# Patient Record
Sex: Female | Born: 1965
Health system: Southern US, Community
[De-identification: ages and names within clinical notes are randomized; demographics above are authoritative.]

## PROBLEM LIST (undated history)

## (undated) DIAGNOSIS — M1990 Unspecified osteoarthritis, unspecified site: Secondary | ICD-10-CM

## (undated) DIAGNOSIS — E669 Obesity, unspecified: Secondary | ICD-10-CM

## (undated) DIAGNOSIS — K219 Gastro-esophageal reflux disease without esophagitis: Secondary | ICD-10-CM

## (undated) DIAGNOSIS — M722 Plantar fascial fibromatosis: Secondary | ICD-10-CM

## (undated) HISTORY — DX: Unspecified osteoarthritis, unspecified site: M19.90

## (undated) HISTORY — DX: Obesity, unspecified: E66.9

## (undated) HISTORY — DX: Gastro-esophageal reflux disease without esophagitis: K21.9

## (undated) HISTORY — DX: Plantar fascial fibromatosis: M72.2

---

## 1998-07-06 ENCOUNTER — Emergency Department (HOSPITAL_COMMUNITY): Admission: EM | Admit: 1998-07-06 | Discharge: 1998-07-06 | Payer: Self-pay | Admitting: Emergency Medicine

## 1998-07-06 ENCOUNTER — Encounter: Payer: Self-pay | Admitting: Emergency Medicine

## 1999-09-29 ENCOUNTER — Emergency Department (HOSPITAL_COMMUNITY): Admission: EM | Admit: 1999-09-29 | Discharge: 1999-09-29 | Payer: Self-pay | Admitting: *Deleted

## 2000-04-04 ENCOUNTER — Encounter: Admission: RE | Admit: 2000-04-04 | Discharge: 2000-04-04 | Payer: Self-pay | Admitting: Family Medicine

## 2000-04-07 ENCOUNTER — Encounter: Admission: RE | Admit: 2000-04-07 | Discharge: 2000-04-07 | Payer: Self-pay | Admitting: Family Medicine

## 2000-05-06 ENCOUNTER — Encounter (INDEPENDENT_AMBULATORY_CARE_PROVIDER_SITE_OTHER): Payer: Self-pay | Admitting: *Deleted

## 2000-05-07 ENCOUNTER — Encounter: Admission: RE | Admit: 2000-05-07 | Discharge: 2000-05-07 | Payer: Self-pay | Admitting: Family Medicine

## 2000-05-18 ENCOUNTER — Inpatient Hospital Stay (HOSPITAL_COMMUNITY): Admission: AD | Admit: 2000-05-18 | Discharge: 2000-05-18 | Payer: Self-pay | Admitting: Obstetrics & Gynecology

## 2000-05-26 ENCOUNTER — Inpatient Hospital Stay (HOSPITAL_COMMUNITY): Admission: AD | Admit: 2000-05-26 | Discharge: 2000-05-26 | Payer: Self-pay | Admitting: Family Medicine

## 2000-05-26 ENCOUNTER — Encounter: Admission: RE | Admit: 2000-05-26 | Discharge: 2000-05-26 | Payer: Self-pay | Admitting: Family Medicine

## 2000-05-26 ENCOUNTER — Encounter: Payer: Self-pay | Admitting: *Deleted

## 2000-06-03 ENCOUNTER — Encounter: Admission: RE | Admit: 2000-06-03 | Discharge: 2000-06-03 | Payer: Self-pay | Admitting: Family Medicine

## 2000-06-12 ENCOUNTER — Inpatient Hospital Stay (HOSPITAL_COMMUNITY): Admission: AD | Admit: 2000-06-12 | Discharge: 2000-06-12 | Payer: Self-pay | Admitting: Obstetrics

## 2000-06-30 ENCOUNTER — Ambulatory Visit (HOSPITAL_COMMUNITY): Admission: RE | Admit: 2000-06-30 | Discharge: 2000-06-30 | Payer: Self-pay | Admitting: Family Medicine

## 2000-07-02 ENCOUNTER — Encounter: Admission: RE | Admit: 2000-07-02 | Discharge: 2000-07-02 | Payer: Self-pay | Admitting: Family Medicine

## 2000-07-16 ENCOUNTER — Encounter: Admission: RE | Admit: 2000-07-16 | Discharge: 2000-07-16 | Payer: Self-pay | Admitting: Family Medicine

## 2000-07-31 ENCOUNTER — Encounter: Admission: RE | Admit: 2000-07-31 | Discharge: 2000-07-31 | Payer: Self-pay | Admitting: Family Medicine

## 2000-08-13 ENCOUNTER — Encounter: Payer: Self-pay | Admitting: Obstetrics

## 2000-08-13 ENCOUNTER — Encounter (INDEPENDENT_AMBULATORY_CARE_PROVIDER_SITE_OTHER): Payer: Self-pay

## 2000-08-13 ENCOUNTER — Encounter: Admission: RE | Admit: 2000-08-13 | Discharge: 2000-08-13 | Payer: Self-pay | Admitting: Family Medicine

## 2000-08-13 ENCOUNTER — Inpatient Hospital Stay (HOSPITAL_COMMUNITY): Admission: AD | Admit: 2000-08-13 | Discharge: 2000-09-26 | Payer: Self-pay | Admitting: Obstetrics

## 2000-08-15 ENCOUNTER — Encounter: Payer: Self-pay | Admitting: Family Medicine

## 2000-08-16 ENCOUNTER — Encounter: Payer: Self-pay | Admitting: Obstetrics

## 2000-08-16 ENCOUNTER — Encounter: Payer: Self-pay | Admitting: *Deleted

## 2000-08-27 ENCOUNTER — Encounter: Payer: Self-pay | Admitting: Obstetrics

## 2000-09-08 ENCOUNTER — Encounter: Payer: Self-pay | Admitting: *Deleted

## 2000-09-11 ENCOUNTER — Encounter: Payer: Self-pay | Admitting: Obstetrics

## 2000-09-15 ENCOUNTER — Encounter: Payer: Self-pay | Admitting: Obstetrics

## 2000-09-18 ENCOUNTER — Encounter: Payer: Self-pay | Admitting: Obstetrics

## 2000-09-19 ENCOUNTER — Encounter: Payer: Self-pay | Admitting: Obstetrics & Gynecology

## 2000-09-20 ENCOUNTER — Encounter: Payer: Self-pay | Admitting: *Deleted

## 2000-09-23 ENCOUNTER — Encounter: Payer: Self-pay | Admitting: Obstetrics

## 2000-09-27 ENCOUNTER — Encounter: Admission: RE | Admit: 2000-09-27 | Discharge: 2000-10-27 | Payer: Self-pay | Admitting: *Deleted

## 2000-09-28 ENCOUNTER — Inpatient Hospital Stay (HOSPITAL_COMMUNITY): Admission: AD | Admit: 2000-09-28 | Discharge: 2000-09-28 | Payer: Self-pay | Admitting: Obstetrics

## 2000-09-30 ENCOUNTER — Inpatient Hospital Stay (HOSPITAL_COMMUNITY): Admission: AD | Admit: 2000-09-30 | Discharge: 2000-09-30 | Payer: Self-pay | Admitting: Obstetrics

## 2000-10-03 ENCOUNTER — Encounter: Admission: RE | Admit: 2000-10-03 | Discharge: 2000-10-03 | Payer: Self-pay | Admitting: Family Medicine

## 2000-10-21 ENCOUNTER — Encounter: Admission: RE | Admit: 2000-10-21 | Discharge: 2000-10-21 | Payer: Self-pay | Admitting: Sports Medicine

## 2000-10-28 ENCOUNTER — Encounter: Admission: RE | Admit: 2000-10-28 | Discharge: 2000-11-27 | Payer: Self-pay | Admitting: *Deleted

## 2000-11-20 ENCOUNTER — Encounter: Payer: Self-pay | Admitting: Emergency Medicine

## 2000-11-20 ENCOUNTER — Emergency Department (HOSPITAL_COMMUNITY): Admission: EM | Admit: 2000-11-20 | Discharge: 2000-11-20 | Payer: Self-pay | Admitting: Emergency Medicine

## 2000-11-21 ENCOUNTER — Encounter: Admission: RE | Admit: 2000-11-21 | Discharge: 2000-11-21 | Payer: Self-pay | Admitting: Family Medicine

## 2003-03-04 ENCOUNTER — Other Ambulatory Visit: Admission: RE | Admit: 2003-03-04 | Discharge: 2003-03-04 | Payer: Self-pay | Admitting: Family Medicine

## 2005-05-23 ENCOUNTER — Encounter: Admission: RE | Admit: 2005-05-23 | Discharge: 2005-05-23 | Payer: Self-pay | Admitting: Family Medicine

## 2005-07-21 ENCOUNTER — Emergency Department (HOSPITAL_COMMUNITY): Admission: EM | Admit: 2005-07-21 | Discharge: 2005-07-21 | Payer: Self-pay | Admitting: Emergency Medicine

## 2005-07-22 ENCOUNTER — Other Ambulatory Visit: Admission: RE | Admit: 2005-07-22 | Discharge: 2005-07-22 | Payer: Self-pay | Admitting: Family Medicine

## 2006-06-25 ENCOUNTER — Encounter: Admission: RE | Admit: 2006-06-25 | Discharge: 2006-06-25 | Payer: Self-pay | Admitting: Family Medicine

## 2006-07-04 ENCOUNTER — Encounter (INDEPENDENT_AMBULATORY_CARE_PROVIDER_SITE_OTHER): Payer: Self-pay | Admitting: *Deleted

## 2007-01-27 ENCOUNTER — Other Ambulatory Visit: Admission: RE | Admit: 2007-01-27 | Discharge: 2007-01-27 | Payer: Self-pay | Admitting: Family Medicine

## 2007-05-18 ENCOUNTER — Ambulatory Visit (HOSPITAL_COMMUNITY): Admission: RE | Admit: 2007-05-18 | Discharge: 2007-05-18 | Payer: Self-pay | Admitting: Family Medicine

## 2007-08-24 ENCOUNTER — Encounter: Admission: RE | Admit: 2007-08-24 | Discharge: 2007-08-24 | Payer: Self-pay | Admitting: Family Medicine

## 2007-09-04 ENCOUNTER — Emergency Department (HOSPITAL_COMMUNITY): Admission: EM | Admit: 2007-09-04 | Discharge: 2007-09-04 | Payer: Self-pay | Admitting: Family Medicine

## 2008-05-10 ENCOUNTER — Other Ambulatory Visit: Admission: RE | Admit: 2008-05-10 | Discharge: 2008-05-10 | Payer: Self-pay | Admitting: Family Medicine

## 2009-03-14 ENCOUNTER — Emergency Department (HOSPITAL_COMMUNITY): Admission: EM | Admit: 2009-03-14 | Discharge: 2009-03-14 | Payer: Self-pay | Admitting: Family Medicine

## 2009-03-22 ENCOUNTER — Encounter: Admission: RE | Admit: 2009-03-22 | Discharge: 2009-03-22 | Payer: Self-pay | Admitting: Family Medicine

## 2009-04-12 ENCOUNTER — Encounter: Admission: RE | Admit: 2009-04-12 | Discharge: 2009-05-04 | Payer: Self-pay | Admitting: Family Medicine

## 2009-05-09 ENCOUNTER — Encounter: Admission: RE | Admit: 2009-05-09 | Discharge: 2009-05-30 | Payer: Self-pay | Admitting: Family Medicine

## 2009-07-05 ENCOUNTER — Encounter: Admission: RE | Admit: 2009-07-05 | Discharge: 2009-07-05 | Payer: Self-pay | Admitting: Family Medicine

## 2009-07-05 ENCOUNTER — Other Ambulatory Visit: Admission: RE | Admit: 2009-07-05 | Discharge: 2009-07-05 | Payer: Self-pay | Admitting: Family Medicine

## 2010-02-14 ENCOUNTER — Emergency Department (HOSPITAL_COMMUNITY): Admission: EM | Admit: 2010-02-14 | Discharge: 2010-02-14 | Payer: Self-pay | Admitting: Family Medicine

## 2010-08-02 ENCOUNTER — Other Ambulatory Visit (HOSPITAL_COMMUNITY)
Admission: RE | Admit: 2010-08-02 | Discharge: 2010-08-02 | Disposition: A | Discharge status: Home / Self Care | Payer: 59 | Source: Ambulatory Visit | Attending: Family Medicine | Admitting: Family Medicine

## 2010-08-02 ENCOUNTER — Other Ambulatory Visit: Payer: Self-pay | Admitting: Family Medicine

## 2010-08-02 DIAGNOSIS — Z01419 Encounter for gynecological examination (general) (routine) without abnormal findings: Secondary | ICD-10-CM | POA: Insufficient documentation

## 2010-08-28 ENCOUNTER — Other Ambulatory Visit: Payer: Self-pay | Admitting: Family Medicine

## 2010-08-28 DIAGNOSIS — Z139 Encounter for screening, unspecified: Secondary | ICD-10-CM

## 2010-08-29 ENCOUNTER — Ambulatory Visit
Admission: RE | Admit: 2010-08-29 | Discharge: 2010-08-29 | Disposition: A | Discharge status: Home / Self Care | Payer: 59 | Source: Ambulatory Visit | Attending: Family Medicine | Admitting: Family Medicine

## 2010-08-29 DIAGNOSIS — Z139 Encounter for screening, unspecified: Secondary | ICD-10-CM

## 2010-09-21 NOTE — Discharge Summary (Signed)
[facility]  Patient:    Cheryl Craig, Cheryl Craig                        MRN: [id number] Adm. Date:  [id number] Disc. Date: [id number] Attending:  Tammi Sou Dictator:   Andrey Spearman, M.D.                           Discharge Summary  DISCHARGE DIAGNOSES:          1. Status post twin intrauterine pregnancy.                               2. Status post preterm labor.                               3. Status post intrauterine growth restriction                                  of Baby A greater than Baby B.                               4. Status post elevated cord Dopplers,                                  Baby A greater than Baby B.                               5. Status post cesarean section for                                  chorioamnionitis.  DISCHARGE MEDICATIONS:        1. Tylox one to two q.4-6h. p.r.n.                               2. Ibuprofen 600 mg q.6h. p.r.n.                               3. Prenatal vitamins one p.o. q.d.  PROCEDURES DONE DURING HOSPITALIZATION:       (1) Ultrasound on April 10 which showed a breech/breech presentation. Growth for Baby A was 42nd percentile, Baby B was 85th percentile. Cervix was 0.56 cm and funneling. (2) Chest x-ray on April 13 which showed pulmonary vascular engorgement. (3) Spiral CT on April 14 which was negative for DVT but showed moderate bilateral polar effusion. (4) Biophysical profile on April 24 which showed a biophysical profile Baby A 6/8, Baby B 6/8, two was subtracted for both for breathing. (5) Ultrasound on May 6 showed both babies in breech presentation, growth for Baby A was less than 10th percentile, Baby B 32nd percentile. The cervix was 1.8 cm at that time. Dopplers on Baby A showed increased umbilical artery Dopplers of 6.1 with intermittent absent diastolic flow. (6) Biophysical profile and Dopplers on May 9 showed BPPs of 6/8 on Baby A  and 8/8 on Baby B. Cord Dopplers  for A showed a cord Doppler of 6.3 with normal middle cerebral artery systolic/diastolic ratio and cord Dopplers of 3.3 with normal MCA flows of Baby B. (7) May 13 a BPP and Dopplers showed BPP of 6/8 on Baby A, 8/8 on Baby B. Cord Dopplers were 7.2 with no absent or reversal of flow and normal MCA flows of Baby A. Baby B showed cord Dopplers of 3.8 with no absent or reversal of flow and normal MCA. (8) On May 16 BPP and Dopplers were again done. Both biophysical profiles were 8/8 on Baby A and they showed     absent diastolic flow with intermittent early reversal of flow but normal      MCA flows on cord Dopplers, and on Baby B showed cord Dopplers of 4.0 with normal MCA flow. (9) On May 17 the BPP showed 8/8 on Baby A and 6/8 on Baby B with no evidence of reversal of flow on either babys cord Dopplers. (10) On May 21 a biophysical profile on Baby A was 6/8 and Baby B 8/8 with unchanged umbilical artery flows.  BRIEF ADMISSION HISTORY AND PHYSICAL:                 This patient is a 45 year old G1, P0, now G1, P0-2-0-2, who was admitted at 71 and 2 weeks by her LMP and 13-week ultrasound with a twin gestational pregnancy and preterm labor. The patient came in without any complaints of contractions but on routine cervical check, her cervix was found to be fingertip, 100%, and -3 station. The patient was admitted to the hospital for preterm labor.  HOSPITAL COURSE:              The patient was admitted at 56 and 2 weeks with a twin intrauterine pregnancy and preterm labor. She was tested for group B strep, placed on continuous toco monitoring. She was started on Unasyn for possible cervicitis. She was begun on magnesium to inhibit any contractions, given betamethasone x 2, and placed on bed rest and in Trendelenburg. The patient also had Motrin started 600 mg q.6h. which she continued for 72 hours. The patients group B strep was negative during the hospitalization. She had preeclampsia  labs done which were within normal limits.  The patient continued on bed rest until May 13 when she became suddenly short of breath at which point it was suspected that she may have a pulmonary embolus due to prolonged bed rest. The patient was oxygen requiring during this time. She underwent chest x-ray and spiral CT, both of which were negative. At this point, the magnesium was turned off and the patient slowly recovered with a slow natural diuresis over the next several days. The patient continued Unasyn therapy for seven days. After the magnesium was stopped, the patient was started on Procardia XL 50 mg a day which she took throughout the remainder of her hospitalization.  The patient had a one-hour glucola done at 26 weeks which was elevated; therefore, she had a three-hour glucola done which was within normal limits with results as follows: 102/160/157/108. She was, however, started on diabetic diet, thought to be borderline diabetic.  The patient continued on bed rest after this point with Procardia for halting uterine activity. The patient had PIH labs checked several times throughout the hospitalization with a slight increase in her LFTs but stable proteinuria; therefore, her blood pressure was monitored closely as well as her PIH labs. However,  she never developed frank PIH.  On May 6 the patient underwent repeat ultrasound to assess for growth and it was found that Baby A had severe IUGR with a growth of less than the 10th percentile which had dropped from the 42nd percentile on the last ultrasound. In addition, they showed elevated cord Dopplers and absent diastolic flow of Baby A. The patient therefore was transferred to antenatal and she underwent continuous monitoring of both Baby A and Baby B with frequent biophysical profiles and ultrasounds as outlined and dated above under procedures. As Baby As and Baby Bs heart rates were reassuring with no decelerations and a few  accelerations not meeting reactivity, the patient continued on bed rest and was monitored closely throughout the remainder of the hospitalization. DD:  11/06/00 TD:  11/06/00 Job: [id number]  [id number]

## 2010-09-21 NOTE — Op Note (Signed)
[facility]  Patient:    Cheryl Craig, Cheryl Craig                        MRN: [id number] Proc. Date: 09/23/00 Adm. Date:  [id number] Attending:  Tammi Sou Dictator:   Jamey Reas, M.D.                           Operative Report  DATE OF BIRTH:                1965-11-16.  ATTENDING PHYSICIAN:          Charles A. Clearance Coots, M.D.  PREOPERATIVE DIAGNOSES:       1. A 30-week intrauterine pregnancy with twins.                               2. Preterm labor.                               3. Severe abdominal pain, presumed                                  chorioamnionitis versus peritonitis.  POSTOPERATIVE DIAGNOSES:      1. A 30-week intrauterine pregnancy with twins.                               2. Preterm labor.                               3. Severe abdominal pain, presumed                                  chorioamnionitis versus peritonitis.                               4. Questionable chorioamnionitis versus uterine                                  fibroid.  PROCEDURE:                    Primary low transverse cesarean section with                               myomectomy via Pfannenstiel.  SURGEON:                      Conni Elliot, M.D.  ASSISTANT:                    Jamey Reas, M.D.                               Andrey Spearman, M.D.  FINDINGS:                     1. Viable female Twin A,  cephalic presentation,                                  Apgars 6 at one minute, 8 at five minutes,                                  cord pH 7.31, weight 466 grams.                               2. Viable female Twin B, footling breech                                  presentation, Apgars 6 at one minute,                                  8 at five minutes, cord pH 7.29,                                  weight 1080 grams.                               3. Two fibroids, one pedunculated on the                                  anterior  fundus and one intramyometrial.  ANESTHESIA:                   Spinal.  IV FLUIDS:                    850 cc.  ESTIMATED BLOOD LOSS:         750 cc.  URINE OUTPUT:                 375 cc clear at the end of the procedure.  SPECIMENS:                    One pedunculated fibroid approximately 2 cm in diameter.  COMPLICATIONS:                None.  CONDITION:                    Stable.  INDICATIONS:                  A 45 year old G1, P0 at 30 weeks with preterm labor, 2 cm and 100% effaced, footling breech presentation, severe abdominal pain, presumed chorioamnionitis.  DESCRIPTION OF PROCEDURE:     The patient was taken to the operating room where spinal anesthesia was introduced and found to be adequate. She was then prepped and draped in normal sterile fashion in dorsal supine position with a leftward tilt. A Pfannenstiel skin incision was then made with the scalpel and carried through to the underlying layer of fascia with the knife. The fascia was incised in the midline, the incision extended laterally with Mayo scissors. The superior aspect of the fascial incision was then grasped with  Kocher clamps, elevated, underlying rectus muscles dissected off bluntly. Attention was then turned to the inferior aspect of this incision which, in a similar fashion, was grasped, tented up with Kocher clamps, and the rectus muscles dissected off bluntly. The rectus muscles were then separated in the midline, the peritoneum identified, tented up, and entered sharply with Metzenbaum scissors. The peritoneal incision was then extended superiorly and inferiorly with good visualization of the bladder. The bladder blade was then inserted and the vesicouterine peritoneum identified and grasped with pickups and entered sharply with Metzenbaum scissors. This incision was then extended laterally and the bladder flap created digitally. The bladder blade was then reinserted in the lower uterine segment,  incised in a transverse fashion with the scalpel. The uterine incision was then extended laterally with blunt dissection. The bladder blade was removed and the infants, Twin A, head delivered atraumatically. The nose and mouth were suctioned with bulb suction, the cord clamped and cut, and the infant was handed off to awaiting pediatricians. Cord gases were sent. Twin B was then delivered footling breech position, cord clamped and cut, and the infant handed off to the awaiting pediatricians. Cord gases were sent. The placenta was then removed manually with the uterus exteriorized and cleared of all clots and debris. The uterus incision was repaired with 1-0 chromic in a running locked fashion. With the second layer the same suture was used to obtain excellent hemostasis. The bladder flap was repaired with 3-0 Vicryl in a running stitch. There was a 2 cm pedunculated fibroid, the stalk of which was crossed with a Kelly clamp, and a ligating #1 chromic suture was used to place around the stalk. A second tie of #1 chromic was used to ligate the stalk even further. Excellent hemostasis was noted. The uterus was returned to the abdomen, the gutters were cleared of all clots, and the peritoneum closed with 2-0 chromic suture. The fascia was reapproximated with 0 Vicryl in a running fashion. The skin was closed with staples. The patient tolerated the procedure well. Sponge, lap, and needle counts were correct x 2. One gram of Cefotan was given at cord clamp. The patient was taken to the recovery room in stable condition. DD:  09/23/00 TD:  09/23/00 Job: [id number]

## 2010-09-21 NOTE — Discharge Summary (Signed)
[facility]  Patient:    Cheryl Craig, Cheryl Craig                        MRN: [id number] Dictator:   Andrey Spearman, M.D.                           Discharge Summary  PER DICTATOR, CANCEL THIS DICTATION DD:  11/06/00 TD:  11/06/00 Job: [id number]

## 2011-09-25 ENCOUNTER — Other Ambulatory Visit: Payer: Self-pay | Admitting: Family Medicine

## 2011-09-25 DIAGNOSIS — Z1231 Encounter for screening mammogram for malignant neoplasm of breast: Secondary | ICD-10-CM

## 2011-10-02 ENCOUNTER — Ambulatory Visit
Admission: RE | Admit: 2011-10-02 | Discharge: 2011-10-02 | Disposition: A | Discharge status: Home / Self Care | Payer: 59 | Source: Ambulatory Visit | Attending: Family Medicine | Admitting: Family Medicine

## 2011-10-02 DIAGNOSIS — Z1231 Encounter for screening mammogram for malignant neoplasm of breast: Secondary | ICD-10-CM

## 2013-03-18 ENCOUNTER — Other Ambulatory Visit: Payer: Self-pay

## 2013-03-18 DIAGNOSIS — Z1231 Encounter for screening mammogram for malignant neoplasm of breast: Secondary | ICD-10-CM

## 2013-03-25 ENCOUNTER — Ambulatory Visit
Admission: RE | Admit: 2013-03-25 | Discharge: 2013-03-25 | Disposition: A | Discharge status: Home / Self Care | Payer: 59 | Source: Ambulatory Visit

## 2013-03-25 DIAGNOSIS — Z1231 Encounter for screening mammogram for malignant neoplasm of breast: Secondary | ICD-10-CM

## 2013-05-07 ENCOUNTER — Ambulatory Visit (INDEPENDENT_AMBULATORY_CARE_PROVIDER_SITE_OTHER): Payer: Self-pay | Admitting: General Surgery

## 2013-06-04 ENCOUNTER — Ambulatory Visit (INDEPENDENT_AMBULATORY_CARE_PROVIDER_SITE_OTHER): Payer: Self-pay | Admitting: General Surgery

## 2013-12-22 ENCOUNTER — Other Ambulatory Visit (HOSPITAL_COMMUNITY)
Admission: RE | Admit: 2013-12-22 | Discharge: 2013-12-22 | Disposition: A | Discharge status: Home / Self Care | Payer: 59 | Source: Ambulatory Visit | Attending: Family Medicine | Admitting: Family Medicine

## 2013-12-22 ENCOUNTER — Other Ambulatory Visit: Payer: Self-pay | Admitting: Family Medicine

## 2013-12-22 DIAGNOSIS — Z124 Encounter for screening for malignant neoplasm of cervix: Secondary | ICD-10-CM | POA: Insufficient documentation

## 2013-12-24 LAB — CYTOLOGY - PAP

## 2014-03-14 ENCOUNTER — Other Ambulatory Visit: Payer: Self-pay

## 2014-03-14 DIAGNOSIS — Z1231 Encounter for screening mammogram for malignant neoplasm of breast: Secondary | ICD-10-CM

## 2014-03-30 ENCOUNTER — Ambulatory Visit
Admission: RE | Admit: 2014-03-30 | Discharge: 2014-03-30 | Disposition: A | Discharge status: Home / Self Care | Payer: 59 | Source: Ambulatory Visit

## 2014-03-30 DIAGNOSIS — Z1231 Encounter for screening mammogram for malignant neoplasm of breast: Secondary | ICD-10-CM

## 2015-05-31 ENCOUNTER — Ambulatory Visit (INDEPENDENT_AMBULATORY_CARE_PROVIDER_SITE_OTHER): Payer: 59 | Admitting: Physician Assistant

## 2015-05-31 VITALS — BP 122/76 | HR 78 | Temp 98.1°F | Resp 16 | Ht 64.0 in | Wt 230.0 lb

## 2015-05-31 DIAGNOSIS — R21 Rash and other nonspecific skin eruption: Secondary | ICD-10-CM

## 2015-05-31 DIAGNOSIS — F439 Reaction to severe stress, unspecified: Secondary | ICD-10-CM

## 2015-05-31 DIAGNOSIS — Z638 Other specified problems related to primary support group: Secondary | ICD-10-CM

## 2015-05-31 MED ORDER — HYDROXYZINE HCL 25 MG PO TABS: 25.0000 mg | ORAL_TABLET | Freq: Three times a day (TID) | ORAL | Status: DC | PRN

## 2015-05-31 MED ORDER — CETIRIZINE HCL 10 MG PO TABS: 10.0000 mg | ORAL_TABLET | Freq: Every day | ORAL | Status: DC

## 2015-05-31 MED ORDER — BETAMETHASONE DIPROPIONATE 0.05 % EX CREA: TOPICAL_CREAM | Freq: Two times a day (BID) | CUTANEOUS | Status: DC

## 2015-05-31 MED ORDER — PERMETHRIN 5 % EX CREA: 1.0000 "application " | TOPICAL_CREAM | Freq: Once | CUTANEOUS | Status: DC

## 2015-05-31 NOTE — Progress Notes (Signed)
06/02/2015 9:02 AM   DOB: Aug 10, 1965 / MRN: [id number]  SUBJECTIVE:  Cheryl Craig is a 50 y.o. female presenting for an itchy rash on her abdomen.  Onset ~3-4 days ago and worsening.  Has no tried anything yet.  No fever, chills, nausea.   Complains of quite a bit of stress at home and feels overwhelmed.  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  Her mother passed within the last year.  Denies sadness and anhedonia.  She is sleeping well.   She has no allergies on file.   She  has no past medical history on file.    She  reports that she has never smoked. She does not have any smokeless tobacco history on file. She  has no sexual activity history on file. The patient  has no past surgical history on file.  Her family history is not on file.  Review of Systems  Constitutional: Negative for fever.  Gastrointestinal: Negative for nausea, diarrhea and constipation.  Skin: Positive for itching and rash.  Neurological: Negative for dizziness and headaches.    Problem list and medications reviewed and updated by myself where necessary, and exist elsewhere in the encounter.   OBJECTIVE:  BP 122/76 mmHg  Pulse 78  Temp(Src) 98.1 F (36.7 C)  Resp 16  Ht 5\' 4"  (1.626 m)  Wt 230 lb (104.327 kg)  BMI 39.46 kg/m2  SpO2 98%  LMP 04/24/2015 (Approximate)  Physical Exam  Constitutional: She is oriented to person, place, and time. She appears well-developed.  Eyes: EOM are normal. Pupils are equal, round, and reactive to light.  Cardiovascular: Normal rate.   Pulmonary/Chest: Effort normal.  Abdominal: She exhibits no distension.  Generalized pustular rash with excoriation.   Musculoskeletal: Normal range of motion.  Neurological: She is alert and oriented to person, place, and time. No cranial nerve deficit.  Skin: Skin is warm and dry. She is not diaphoretic.  Psychiatric: She has a normal mood and affect. Her  mood appears not anxious. Her affect is not angry, not blunt and not inappropriate. Her speech is not rapid and/or pressured. She is not agitated, not aggressive, not hyperactive, not slowed and not withdrawn. Cognition and memory are not impaired. She does not express impulsivity. She does not exhibit a depressed mood.  Vitals reviewed.   No results found for this or any previous visit (from the past 72 hour(s)).  ASSESSMENT AND PLAN  Kendy was seen today for rash, stress and weight gain.  Diagnoses and all orders for this visit:  Rash and nonspecific skin eruption: Will cover for a mite infestation as it appears that something is biting her.  RTC in 2 weeks if the rash fails to clear.  RTC sooner if symptoms change or worsen.   -     permethrin (ACTICIN) 5 % cream; Apply 1 application topically once. Cover yourself, neck to toe, and leave on for 8 hours. Reapply in 7 days. -     betamethasone dipropionate (DIPROLENE) 0.05 % cream; Apply topically 2 (two) times daily. -     hydrOXYzine (ATARAX/VISTARIL) 25 MG tablet; Take 1 tablet (25 mg total) by mouth 3 (three) times daily as needed. -     cetirizine (ZYRTEC) 10 MG tablet; Take 1 tablet (10 mg total) by mouth daily.  Stress at home: Advised counseling and offered referral however patient declined.  Advised exercise.  I do not  think the benefits of an SSRI would outweigh the risks at this time.      The patient was advised to call or return to clinic if she does not see an improvement in symptoms or to seek the care of the closest emergency department if she worsens with the above plan.   Philis Fendt, MHS, PA-C [facility] 06/02/2015 9:02 AM

## 2015-06-01 MED FILL — hydrOXYzine HCL 25 MG TABS: 25 | 10 days supply | Qty: 30 | Fill #0

## 2015-06-01 MED FILL — PERMETHRIN 5% CREAM: 5 | 7 days supply | Qty: 60 | Fill #0

## 2015-06-01 MED FILL — BETAMETHASONE DP 0.05% CRM: 0.05 | 10 days supply | Qty: 30 | Fill #0

## 2015-08-03 DIAGNOSIS — J9801 Acute bronchospasm: Secondary | ICD-10-CM | POA: Diagnosis not present

## 2015-08-03 DIAGNOSIS — B349 Viral infection, unspecified: Secondary | ICD-10-CM | POA: Diagnosis not present

## 2015-08-09 DIAGNOSIS — K219 Gastro-esophageal reflux disease without esophagitis: Secondary | ICD-10-CM | POA: Diagnosis not present

## 2015-08-09 DIAGNOSIS — M722 Plantar fascial fibromatosis: Secondary | ICD-10-CM | POA: Diagnosis not present

## 2015-08-09 DIAGNOSIS — M545 Low back pain: Secondary | ICD-10-CM | POA: Diagnosis not present

## 2015-08-09 DIAGNOSIS — G8929 Other chronic pain: Secondary | ICD-10-CM | POA: Diagnosis not present

## 2015-08-09 DIAGNOSIS — E669 Obesity, unspecified: Secondary | ICD-10-CM | POA: Diagnosis not present

## 2015-08-14 ENCOUNTER — Other Ambulatory Visit (HOSPITAL_COMMUNITY): Payer: Self-pay | Admitting: General Surgery

## 2015-09-04 ENCOUNTER — Ambulatory Visit: Payer: 59 | Admitting: Dietician

## 2015-10-31 ENCOUNTER — Other Ambulatory Visit (HOSPITAL_COMMUNITY): Payer: Self-pay | Admitting: General Surgery

## 2015-10-31 DIAGNOSIS — E669 Obesity, unspecified: Secondary | ICD-10-CM

## 2015-12-11 ENCOUNTER — Ambulatory Visit (HOSPITAL_COMMUNITY): Payer: 59

## 2016-01-11 DIAGNOSIS — J309 Allergic rhinitis, unspecified: Secondary | ICD-10-CM | POA: Diagnosis not present

## 2016-01-11 DIAGNOSIS — D1721 Benign lipomatous neoplasm of skin and subcutaneous tissue of right arm: Secondary | ICD-10-CM | POA: Diagnosis not present

## 2016-01-11 DIAGNOSIS — Z23 Encounter for immunization: Secondary | ICD-10-CM | POA: Diagnosis not present

## 2016-01-11 DIAGNOSIS — M25569 Pain in unspecified knee: Secondary | ICD-10-CM | POA: Diagnosis not present

## 2016-01-11 DIAGNOSIS — K5900 Constipation, unspecified: Secondary | ICD-10-CM | POA: Diagnosis not present

## 2016-01-11 DIAGNOSIS — Z0000 Encounter for general adult medical examination without abnormal findings: Secondary | ICD-10-CM | POA: Diagnosis not present

## 2016-02-24 ENCOUNTER — Encounter: Payer: Self-pay | Admitting: Family Medicine

## 2016-05-30 DIAGNOSIS — M25551 Pain in right hip: Secondary | ICD-10-CM | POA: Diagnosis not present

## 2016-05-30 DIAGNOSIS — M25561 Pain in right knee: Secondary | ICD-10-CM | POA: Diagnosis not present

## 2016-05-30 DIAGNOSIS — G8929 Other chronic pain: Secondary | ICD-10-CM | POA: Diagnosis not present

## 2016-05-31 ENCOUNTER — Other Ambulatory Visit (HOSPITAL_COMMUNITY): Payer: Self-pay | Admitting: Sports Medicine

## 2016-05-31 DIAGNOSIS — M25561 Pain in right knee: Principal | ICD-10-CM

## 2016-05-31 DIAGNOSIS — G8929 Other chronic pain: Secondary | ICD-10-CM

## 2016-06-03 ENCOUNTER — Ambulatory Visit (HOSPITAL_COMMUNITY): Payer: 59

## 2016-06-03 ENCOUNTER — Encounter (HOSPITAL_COMMUNITY): Payer: Self-pay

## 2016-06-05 MED FILL — ETODOLAC 400 MG TABLET: 400 | 30 days supply | Qty: 60 | Fill #0

## 2016-06-11 DIAGNOSIS — E669 Obesity, unspecified: Secondary | ICD-10-CM | POA: Diagnosis not present

## 2016-06-11 DIAGNOSIS — M25569 Pain in unspecified knee: Secondary | ICD-10-CM | POA: Diagnosis not present

## 2016-06-21 MED FILL — CONTRAVE ER 8-90 MG TABLET: 8-90 | 28 days supply | Qty: 70 | Fill #0

## 2016-07-23 MED FILL — CONTRAVE ER 8-90 MG TABLET: 8-90 | 28 days supply | Qty: 100 | Fill #0

## 2016-10-17 MED FILL — ETODOLAC 400 MG TABLET: 400 | 30 days supply | Qty: 60 | Fill #1

## 2016-10-24 ENCOUNTER — Emergency Department (HOSPITAL_COMMUNITY)
Admission: EM | Admit: 2016-10-24 | Discharge: 2016-10-24 | Disposition: A | Discharge status: Home / Self Care | Payer: 59 | Attending: Emergency Medicine | Admitting: Emergency Medicine

## 2016-10-24 ENCOUNTER — Encounter (HOSPITAL_COMMUNITY): Payer: Self-pay | Admitting: *Deleted

## 2016-10-24 ENCOUNTER — Ambulatory Visit (INDEPENDENT_AMBULATORY_CARE_PROVIDER_SITE_OTHER): Payer: 59 | Admitting: Physician Assistant

## 2016-10-24 ENCOUNTER — Encounter: Payer: Self-pay | Admitting: Physician Assistant

## 2016-10-24 VITALS — BP 117/73 | HR 75 | Temp 98.2°F | Resp 18 | Ht 63.94 in | Wt 248.2 lb

## 2016-10-24 DIAGNOSIS — M79604 Pain in right leg: Secondary | ICD-10-CM | POA: Diagnosis not present

## 2016-10-24 DIAGNOSIS — M79661 Pain in right lower leg: Secondary | ICD-10-CM | POA: Insufficient documentation

## 2016-10-24 LAB — D-DIMER, QUANTITATIVE (NOT AT ARMC): D-DIMER: 0.59 mg{FEU}/L — AB (ref 0.00–0.49)

## 2016-10-24 MED ORDER — IBUPROFEN 400 MG PO TABS
600.0000 mg | ORAL_TABLET | Freq: Once | ORAL | Status: AC
  Administered 2016-10-24: 600 mg via ORAL
  Filled 2016-10-24: qty 1

## 2016-10-24 MED ORDER — HYDROCODONE-ACETAMINOPHEN 5-325 MG PO TABS: 1.0000 | ORAL_TABLET | Freq: Four times a day (QID) | ORAL | 0 refills | Status: DC | PRN

## 2016-10-24 MED ORDER — ACETAMINOPHEN 325 MG PO TABS
325.0000 mg | ORAL_TABLET | Freq: Once | ORAL | Status: AC
  Administered 2016-10-24: 325 mg via ORAL
  Filled 2016-10-24: qty 1

## 2016-10-24 MED ORDER — OXYCODONE-ACETAMINOPHEN 5-325 MG PO TABS
1.0000 | ORAL_TABLET | Freq: Once | ORAL | Status: AC
  Administered 2016-10-24: 1 via ORAL
  Filled 2016-10-24: qty 1

## 2016-10-24 MED ORDER — CYCLOBENZAPRINE HCL 10 MG PO TABS: 5.0000 mg | ORAL_TABLET | Freq: Three times a day (TID) | ORAL | 0 refills | Status: DC | PRN

## 2016-10-24 MED ORDER — ENOXAPARIN SODIUM 120 MG/0.8ML ~~LOC~~ SOLN
1.0000 mg/kg | Freq: Once | SUBCUTANEOUS | Status: AC
  Administered 2016-10-24: 115 mg via SUBCUTANEOUS
  Filled 2016-10-24: qty 0.8

## 2016-10-24 NOTE — Patient Instructions (Signed)
     IF you received an x-ray today, you will receive an invoice from [facility]. Please contact [facility] at [phone] with questions or concerns regarding your invoice.   IF you received labwork today, you will receive an invoice from [facility]. Please contact [facility] at [phone] with questions or concerns regarding your invoice.   Our billing staff will not be able to assist you with questions regarding bills from these companies.  You will be contacted with the lab results as soon as they are available. The fastest way to get your results is to activate your My Chart account. Instructions are located on the last page of this paperwork. If you have not heard from us regarding the results in 2 weeks, please contact this office.     

## 2016-10-24 NOTE — Progress Notes (Signed)
    10/24/2016 4:41 PM   DOB: 08-Jul-1965 / MRN: [id number]  SUBJECTIVE:  Cheryl Craig is a 51 y.o. female presenting for right sided calf cramping.  Feels that this is no worse or better.  Started at 6 pm yesterday. She did some exercise, which included burpees, and this was at about 5 pm. She denies a history of DVT, negative for history of bleed disorder. She denies any OCP or estrogen products. She does not smoke.      She has No Known Allergies.   She  has no past medical history on file.    She  reports that she has never smoked. She has never used smokeless tobacco. She reports that she does not drink alcohol or use drugs. She  has no sexual activity history on file. The patient  has no past surgical history on file.  Her family history includes Arthritis in her mother.  Review of Systems  Constitutional: Negative for chills and fever.  HENT: Negative for sore throat.   Gastrointestinal: Negative for nausea.  Musculoskeletal: Positive for myalgias. Negative for back pain, falls, joint pain and neck pain.  Skin: Negative for itching and rash.  Neurological: Negative for dizziness.  Psychiatric/Behavioral: Negative for depression.    The problem list and medications were reviewed and updated by myself where necessary and exist elsewhere in the encounter.   OBJECTIVE:  BP 117/73 (BP Location: Right Arm, Patient Position: Sitting, Cuff Size: Large)   Pulse 75   Temp 98.2 F (36.8 C) (Oral)   Resp 18   Ht 5' 3.94" (1.624 m)   Wt 248 lb 3.2 oz (112.6 kg)   LMP 09/19/2016 (Approximate)   SpO2 98%   BMI 42.69 kg/m   Physical Exam  Constitutional: She is oriented to person, place, and time.  Cardiovascular: Normal rate, regular rhythm and normal heart sounds.   Pulmonary/Chest: Effort normal and breath sounds normal.  Musculoskeletal: Normal range of motion. She exhibits tenderness (negative for asymmetry relative to left, negative Homan's sign. ). She exhibits no edema or  deformity.  Neurological: She is alert and oriented to person, place, and time. She has normal reflexes.  Skin: Skin is warm and dry.    No results found for this or any previous visit (from the past 72 hour(s)).  No results found.  ASSESSMENT AND PLAN:  Cheryl Craig was seen today for leg pain.  Diagnoses and all orders for this visit:  Leg pain, posterior, right: Likely calf strain, however I can not say this is not a DVT without more work up.  Will screen with a d-dimer and if positive will call her and advise that she proceed to the ED for anticoagulation and a scan of the affected leg.   -     D-dimer, quantitative (not at [facility]) -     Basic metabolic panel -     cyclobenzaprine (FLEXERIL) 10 MG tablet; Take 0.5-1 tablets (5-10 mg total) by mouth 3 (three) times daily as needed for muscle spasms. Do not mix with narcotics.    The patient is advised to call or return to clinic if she does not see an improvement in symptoms, or to seek the care of the closest emergency department if she worsens with the above plan.   Cheryl Craig, MHS, PA-C [facility] 10/24/2016 4:41 PM

## 2016-10-24 NOTE — ED Provider Notes (Signed)
[facility] DEPT Provider Note   CSN: [id number] Arrival date & time: 10/24/16  2008     History   Chief Complaint Chief Complaint  Patient presents with  . DVT    HPI Cheryl Craig is a 51 y.o. female with no past medical history presenting for further evaluation of DVT. She was seen earlier today in office for right calf pain which was believed to be likely muscle strain but a d-dimer was obtained and returned elevated. Patient was then called and advised to come to the emergency department for further evaluation. Denies chest pain, shortness of breath, or other symptoms. No history of DVT/PE, malignancy, prolonged immobilization, recent surgery, estrogen use, cough, hemoptysis  HPI  History reviewed. No pertinent past medical history.  There are no active problems to display for this patient.   History reviewed. No pertinent surgical history.  OB History    No data available       Home Medications    Prior to Admission medications   Medication Sig Start Date End Date Taking? Authorizing Provider  acetaminophen (TYLENOL) 325 MG tablet Take 650 mg by mouth every 6 (six) hours as needed for moderate pain or fever.   Yes [provider]  ibuprofen (ADVIL,MOTRIN) 200 MG tablet Take 600 mg by mouth every 6 (six) hours as needed for headache or moderate pain.   Yes [provider]  betamethasone dipropionate (DIPROLENE) 0.05 % cream Apply topically 2 (two) times daily. Patient not taking: Reported on 10/24/2016 05/31/15   Tereasa Coop, PA-C  cetirizine (ZYRTEC) 10 MG tablet Take 1 tablet (10 mg total) by mouth daily. Patient not taking: Reported on 10/24/2016 05/31/15   Tereasa Coop, PA-C  cyclobenzaprine (FLEXERIL) 10 MG tablet Take 0.5-1 tablets (5-10 mg total) by mouth 3 (three) times daily as needed for muscle spasms. Do not mix with narcotics. Patient not taking: Reported on 10/24/2016 10/24/16   Tereasa Coop, PA-C    HYDROcodone-acetaminophen (NORCO/VICODIN) 5-325 MG tablet Take 1-2 tablets by mouth every 6 (six) hours as needed for severe pain. 10/24/16   Avie Echevaria B, PA-C  hydrOXYzine (ATARAX/VISTARIL) 25 MG tablet Take 1 tablet (25 mg total) by mouth 3 (three) times daily as needed. Patient not taking: Reported on 10/24/2016 05/31/15   Tereasa Coop, PA-C  permethrin (ACTICIN) 5 % cream Apply 1 application topically once. Cover yourself, neck to toe, and leave on for 8 hours. Reapply in 7 days. Patient not taking: Reported on 10/24/2016 05/31/15   Tereasa Coop, PA-C    Family History Family History  Problem Relation Age of Onset  . Arthritis Mother     Social History Social History  Substance Use Topics  . Smoking status: Never Smoker  . Smokeless tobacco: Never Used  . Alcohol use No     Allergies   Patient has no known allergies.   Review of Systems Review of Systems  Constitutional: Negative for chills and fever.  HENT: Negative for ear pain and sore throat.   Eyes: Negative for pain and visual disturbance.  Respiratory: Negative for cough, choking, chest tightness, shortness of breath, wheezing and stridor.   Cardiovascular: Negative for chest pain, palpitations and leg swelling.  Gastrointestinal: Negative for abdominal distention, abdominal pain, nausea and vomiting.  Genitourinary: Negative for dysuria and hematuria.  Musculoskeletal: Positive for myalgias. Negative for arthralgias, back pain, gait problem, joint swelling, neck pain and neck stiffness.  Skin: Negative for color change, pallor and rash.  Neurological:  Negative for dizziness, seizures, syncope, weakness, numbness and headaches.     Physical Exam Updated Vital Signs BP 117/72   Pulse 84   Temp 98.4 F (36.9 C) (Oral)   Resp 16   SpO2 98%   Physical Exam  Constitutional: She appears well-developed and well-nourished. No distress.  Afebrile, nontoxic-appearing, lying comfortably in bed in no  acute distress.  HENT:  Head: Normocephalic and atraumatic.  Eyes: EOM are normal.  Neck: Normal range of motion.  Cardiovascular: Normal rate, regular rhythm, normal heart sounds and intact distal pulses.   No murmur heard. Pulmonary/Chest: Effort normal and breath sounds normal. No respiratory distress. She has no wheezes. She has no rales. She exhibits no tenderness.  Abdominal: She exhibits no distension.  Musculoskeletal: Normal range of motion. She exhibits edema and tenderness. She exhibits no deformity.  Tenderness palpation of the popliteal region on the right and right calf. Slight pitting edema bilaterally. Right lower leg does appear slightly more swollen than the left. Strong dorsalis pedis pulses and neurovascularly intact distally.  Neurological: She is alert. No sensory deficit.  Skin: Skin is warm and dry. She is not diaphoretic. No erythema. No pallor.  Psychiatric: She has a normal mood and affect.  Nursing note and vitals reviewed.    ED Treatments / Results  Labs (all labs ordered are listed, but only abnormal results are displayed) Labs Reviewed - No data to display  EKG  EKG Interpretation None       Radiology No results found.  Procedures Procedures (including critical care time)  Medications Ordered in ED Medications  enoxaparin (LOVENOX) injection 115 mg (115 mg Subcutaneous Given 10/24/16 2336)  oxyCODONE-acetaminophen (PERCOCET/ROXICET) 5-325 MG per tablet 1 tablet (1 tablet Oral Given 10/24/16 2335)  ibuprofen (ADVIL,MOTRIN) tablet 600 mg (600 mg Oral Given 10/24/16 2335)  acetaminophen (TYLENOL) tablet 325 mg (325 mg Oral Given 10/24/16 2335)     Initial Impression / Assessment and Plan / ED Course  I have reviewed the triage vital signs and the nursing notes.  Pertinent labs & imaging results that were available during my care of the patient were reviewed by me and considered in my medical decision making (see chart for details).       Patient presenting for further evaluation of DVT. She was seen earlier in office and believed to have a muscle strain but d-dimer returned elevated so she received a call advising her to come to the emergency department for further evaluation.  She endorses some right calf cramping after working out yesterday and some discomfort to the left leg as well but not as severe.  Ordered DVT study for outpatient as it is now after hours. Will give patient a dose of lovenox and have her return for US doppler in the morning. She is otherwise well-appearing and stable for discharge. Suspect potential bakers cyst.  Discharge home with pain management and follow up for DVT study and ortho f/u.  Discussed strict return precautions and advised to return to the emergency department if experiencing any new or worsening symptoms. Instructions were understood and patient agreed with discharge plan.  Final Clinical Impressions(s) / ED Diagnoses   Final diagnoses:  Right calf pain    New Prescriptions Discharge Medication List as of 10/24/2016 10:55 PM    START taking these medications   Details  HYDROcodone-acetaminophen (NORCO/VICODIN) 5-325 MG tablet Take 1-2 tablets by mouth every 6 (six) hours as needed for severe pain., Starting Thu 10/24/2016, Print  Dossie Der 10/25/16 Ileene Musa    Jola Schmidt, MD 10/25/16 [phone]

## 2016-10-24 NOTE — ED Notes (Signed)
Waiting on Lovenox to come from pharmacy.

## 2016-10-24 NOTE — ED Triage Notes (Signed)
Pt states she was sent by [facility] rt an elevated d-dimer. Pt states she began having right calf pain yesterday and went to [facility] today for eval. Pt is able to ambulate without difficulty and denies any HRT, long trips, or hx of blood clots.

## 2016-10-24 NOTE — Discharge Instructions (Signed)
As discussed, follow up in the morning for ultrasound of your leg to evaluate for blood clot. You were given a dose of blood thinner tonight that will last 12 hours.  Return to the emergency department immediately if you experience chest pain, pain with breathing, difficulty breathing, or other concerning symptoms in the meantime.

## 2016-10-25 ENCOUNTER — Ambulatory Visit (HOSPITAL_COMMUNITY)
Admission: RE | Admit: 2016-10-25 | Discharge: 2016-10-25 | Disposition: A | Discharge status: Home / Self Care | Payer: 59 | Source: Ambulatory Visit | Attending: Emergency Medicine | Admitting: Emergency Medicine

## 2016-10-25 DIAGNOSIS — M79605 Pain in left leg: Secondary | ICD-10-CM | POA: Diagnosis not present

## 2016-10-25 DIAGNOSIS — M79609 Pain in unspecified limb: Secondary | ICD-10-CM | POA: Diagnosis not present

## 2016-10-25 LAB — BASIC METABOLIC PANEL
BUN / CREAT RATIO: 18 (ref 9–23)
BUN: 13 mg/dL (ref 6–24)
CHLORIDE: 102 mmol/L (ref 96–106)
CO2: 24 mmol/L (ref 20–29)
Calcium: 9.1 mg/dL (ref 8.7–10.2)
Creatinine, Ser: 0.73 mg/dL (ref 0.57–1.00)
GFR calc Af Amer: 113 mL/min/{1.73_m2} (ref >59)
GFR calc non Af Amer: 98 mL/min/{1.73_m2} (ref >59)
GLUCOSE: 81 mg/dL (ref 65–99)
Potassium: 4.3 mmol/L (ref 3.5–5.2)
SODIUM: 138 mmol/L (ref 134–144)

## 2016-10-25 MED FILL — HYDROCODON-APAP 5-325: 5-325 | 1 days supply | Qty: 8 | Fill #0

## 2016-10-25 NOTE — Progress Notes (Signed)
*  Preliminary Results* Right lower extremity venous duplex completed. Right lower extremity is negative for deep vein thrombosis. There is no evidence of right Baker's cyst.  10/25/2016 9:40 AM  Maudry Mayhew, BS, RVT, RDCS, RDMS

## 2016-10-31 MED FILL — ALPRAZolam 0.25 MG TABS: 0.25 | 5 days supply | Qty: 5 | Fill #0

## 2016-11-02 ENCOUNTER — Ambulatory Visit (HOSPITAL_COMMUNITY)
Admission: RE | Admit: 2016-11-02 | Discharge: 2016-11-02 | Disposition: A | Discharge status: Home / Self Care | Payer: 59 | Source: Ambulatory Visit | Attending: Sports Medicine | Admitting: Sports Medicine

## 2016-11-02 DIAGNOSIS — M25561 Pain in right knee: Secondary | ICD-10-CM | POA: Insufficient documentation

## 2016-11-02 DIAGNOSIS — M65861 Other synovitis and tenosynovitis, right lower leg: Secondary | ICD-10-CM | POA: Diagnosis not present

## 2016-11-02 DIAGNOSIS — M25461 Effusion, right knee: Secondary | ICD-10-CM | POA: Diagnosis not present

## 2016-11-02 DIAGNOSIS — S83241A Other tear of medial meniscus, current injury, right knee, initial encounter: Secondary | ICD-10-CM | POA: Diagnosis not present

## 2016-11-02 DIAGNOSIS — G8929 Other chronic pain: Secondary | ICD-10-CM | POA: Diagnosis not present

## 2016-11-08 DIAGNOSIS — M1711 Unilateral primary osteoarthritis, right knee: Secondary | ICD-10-CM | POA: Diagnosis not present

## 2016-11-08 DIAGNOSIS — G8929 Other chronic pain: Secondary | ICD-10-CM | POA: Diagnosis not present

## 2016-11-08 DIAGNOSIS — M23321 Other meniscus derangements, posterior horn of medial meniscus, right knee: Secondary | ICD-10-CM | POA: Diagnosis not present

## 2016-11-08 DIAGNOSIS — M25561 Pain in right knee: Secondary | ICD-10-CM | POA: Diagnosis not present

## 2016-11-08 MED FILL — DICLOFENAC SOD EC 75 MG TAB: 75 | 30 days supply | Qty: 60 | Fill #0

## 2016-11-25 ENCOUNTER — Telehealth: Payer: 59 | Admitting: Nurse Practitioner

## 2016-11-25 DIAGNOSIS — H00015 Hordeolum externum left lower eyelid: Secondary | ICD-10-CM

## 2016-11-25 MED ORDER — NEOMYCIN-POLYMYXIN-HC 3.5-10000-1 OP SUSP: 3.0000 [drp] | OPHTHALMIC | 0 refills | Status: AC

## 2016-11-25 MED FILL — NEOMYCIN/POLY/HC EYE DROPS: 3.5-10000-1 | 7 days supply | Qty: 8 | Fill #0

## 2016-11-25 NOTE — Progress Notes (Signed)
We are sorry that you are not feeling well. Here is how we plan to help!  Based on what you have shared with me it looks like you have a stye.  A stye is an inflammation of the eyelid.  It is often a red, painful lump near the edge of the eyelid that may look like a boil or a pimple.  A stye develops when an infection occurs at the base of an eyelash.   We have made appropriate suggestions for you based upon your presentation: Your symptoms may indicate an infection of the sclera.  The use of anti-inflammatory and antibiotic eye drops for a week will help resolve this condition.  I have sent in neomycin-polymyxin HC opthalmic suspension, two to three drops in the affected eye every 4 hours.  If your symptoms do not improve over the next two to three days you should be seen in your doctor's office.  HOME CARE:   Wash your hands often!  Let the stye open on its own. Don't squeeze or open it.  Don't rub your eyes. This can irritate your eyes and let in bacteria.  If you need to touch your eyes, wash your hands first.  Don't wear eye makeup or contact lenses until the area has healed.  GET HELP RIGHT AWAY IF:   Your symptoms do not improve.  You develop blurred or loss of vision.  Your symptoms worsen (increased discharge, pain or redness).  Thank you for choosing an e-visit.  Your e-visit answers were reviewed by a board certified advanced clinical practitioner to complete your personal care plan.  Depending upon the condition, your plan could have included both over the counter or prescription medications.  Please review your pharmacy choice.  Make sure the pharmacy is open so you can pick up prescription now.  If there is a problem, you may contact your provider through MyChart messaging and have the prescription routed to another pharmacy.    Your safety is important to us.  If you have drug allergies check your prescription carefully.  For the next 24 hours you can use MyChart to  ask questions about today's visit, request a non-urgent call back, or ask for a work or school excuse.  You will get an email in the next two days asking about your experience.  I hope you that your e-visit has been valuable and will speed your recovery.      

## 2016-12-06 DIAGNOSIS — G8929 Other chronic pain: Secondary | ICD-10-CM | POA: Diagnosis not present

## 2016-12-06 DIAGNOSIS — M1711 Unilateral primary osteoarthritis, right knee: Secondary | ICD-10-CM | POA: Diagnosis not present

## 2016-12-06 DIAGNOSIS — M25561 Pain in right knee: Secondary | ICD-10-CM | POA: Diagnosis not present

## 2016-12-06 DIAGNOSIS — M23321 Other meniscus derangements, posterior horn of medial meniscus, right knee: Secondary | ICD-10-CM | POA: Diagnosis not present

## 2016-12-06 MED FILL — MELOXICAM 15 MG TABLET: 15 | 30 days supply | Qty: 30 | Fill #0

## 2017-01-14 ENCOUNTER — Other Ambulatory Visit: Payer: Self-pay | Admitting: Family Medicine

## 2017-01-14 ENCOUNTER — Other Ambulatory Visit (HOSPITAL_COMMUNITY)
Admission: RE | Admit: 2017-01-14 | Discharge: 2017-01-14 | Disposition: A | Discharge status: Home / Self Care | Payer: 59 | Source: Ambulatory Visit | Attending: Family Medicine | Admitting: Family Medicine

## 2017-01-14 DIAGNOSIS — E669 Obesity, unspecified: Secondary | ICD-10-CM | POA: Diagnosis not present

## 2017-01-14 DIAGNOSIS — Z23 Encounter for immunization: Secondary | ICD-10-CM | POA: Diagnosis not present

## 2017-01-14 DIAGNOSIS — Z0000 Encounter for general adult medical examination without abnormal findings: Secondary | ICD-10-CM | POA: Diagnosis not present

## 2017-01-14 DIAGNOSIS — Z124 Encounter for screening for malignant neoplasm of cervix: Secondary | ICD-10-CM | POA: Diagnosis not present

## 2017-01-14 DIAGNOSIS — B009 Herpesviral infection, unspecified: Secondary | ICD-10-CM | POA: Diagnosis not present

## 2017-01-14 DIAGNOSIS — Z13220 Encounter for screening for lipoid disorders: Secondary | ICD-10-CM | POA: Diagnosis not present

## 2017-01-14 DIAGNOSIS — J309 Allergic rhinitis, unspecified: Secondary | ICD-10-CM | POA: Diagnosis not present

## 2017-01-14 DIAGNOSIS — N959 Unspecified menopausal and perimenopausal disorder: Secondary | ICD-10-CM | POA: Diagnosis not present

## 2017-01-14 DIAGNOSIS — Z131 Encounter for screening for diabetes mellitus: Secondary | ICD-10-CM | POA: Diagnosis not present

## 2017-01-15 ENCOUNTER — Other Ambulatory Visit (HOSPITAL_COMMUNITY): Payer: Self-pay | Admitting: General Surgery

## 2017-01-15 DIAGNOSIS — M170 Bilateral primary osteoarthritis of knee: Secondary | ICD-10-CM | POA: Diagnosis not present

## 2017-01-16 LAB — CYTOLOGY - PAP
Diagnosis: NEGATIVE
HPV (WINDOPATH): NOT DETECTED

## 2017-01-23 ENCOUNTER — Ambulatory Visit (HOSPITAL_COMMUNITY): Payer: 59

## 2017-01-23 ENCOUNTER — Other Ambulatory Visit (HOSPITAL_COMMUNITY): Payer: 59

## 2017-01-31 ENCOUNTER — Ambulatory Visit (HOSPITAL_COMMUNITY)
Admission: RE | Admit: 2017-01-31 | Discharge: 2017-01-31 | Disposition: A | Discharge status: Home / Self Care | Payer: 59 | Source: Ambulatory Visit | Attending: General Surgery | Admitting: General Surgery

## 2017-01-31 DIAGNOSIS — Z01818 Encounter for other preprocedural examination: Secondary | ICD-10-CM | POA: Diagnosis not present

## 2017-01-31 DIAGNOSIS — Z9884 Bariatric surgery status: Secondary | ICD-10-CM | POA: Diagnosis not present

## 2017-01-31 DIAGNOSIS — Z01810 Encounter for preprocedural cardiovascular examination: Secondary | ICD-10-CM | POA: Insufficient documentation

## 2017-01-31 DIAGNOSIS — R918 Other nonspecific abnormal finding of lung field: Secondary | ICD-10-CM | POA: Diagnosis not present

## 2017-02-04 ENCOUNTER — Other Ambulatory Visit: Payer: Self-pay | Admitting: Family Medicine

## 2017-02-04 DIAGNOSIS — Z1231 Encounter for screening mammogram for malignant neoplasm of breast: Secondary | ICD-10-CM

## 2017-02-06 ENCOUNTER — Encounter: Payer: Self-pay | Admitting: Registered"

## 2017-02-06 ENCOUNTER — Encounter: Payer: 59 | Attending: General Surgery | Admitting: Registered"

## 2017-02-06 DIAGNOSIS — E669 Obesity, unspecified: Secondary | ICD-10-CM | POA: Insufficient documentation

## 2017-02-06 DIAGNOSIS — Z713 Dietary counseling and surveillance: Secondary | ICD-10-CM | POA: Insufficient documentation

## 2017-02-06 NOTE — Progress Notes (Signed)
Pre-Op Assessment Visit:  Pre-Operative Sleeve Gastrectomy Surgery  Medical Nutrition Therapy:  Appt start time: 4:15  End time:  5:20  Patient was seen on 02/06/2017 for Pre-Operative Nutrition Assessment. Assessment and letter of approval faxed to [facility] Bariatric Surgery Program coordinator on 02/06/2017.   Pt expectation of surgery: be more active, reduce pressure off knees  Pt expectation of Dietitian: support, guidance, and accountability  Start weight at [facility]: 255.9 BMI: 43.58   Pt states she has a torn meniscus in right knee. Pt states she loves to workout and walk. Pt states she is starting to have arthritis. Pt states she is very disciplined. Pt states she does not drink much liquid during the day.   Per insurance, pt needs 0 SWL visits prior to surgery.    24 hr Dietary Recall: First Meal: 2 boiled eggs, sausage Snack: none Second Meal: sometimes skips; meat and 2 vegetables or [employer]-salad or cold cut on wheat Snack: nuts, fruit, or boston baked beans Third Meal: spaghetti or lasagna or chicken/pork chops (air fryer), canned vegetables Snack: none Beverages: water with crystal light, coffee   Encouraged to engage in 75 minutes of moderate physical activity including cardiovascular and weight baring weekly  Handouts given during visit include:  . Pre-Op Goals . Bariatric Surgery Protein Shakes . Vitamin and Mineral Recommendations  During the appointment today the following Pre-Op Goals were reviewed with the patient: . Maintain or lose weight as instructed by your surgeon . Make healthy food choices . Begin to limit portion sizes . Limited concentrated sugars and fried foods . Keep fat/sugar in the single digits per serving on         food labels . Practice CHEWING your food  (aim for 30 chews per bite or until applesauce consistency) . Practice not drinking 15 minutes before, during, and 30 minutes after each meal/snack . Avoid all carbonated  beverages  . Avoid/limit caffeinated beverages  . Avoid all sugar-sweetened beverages . Consume 3 meals per day; eat every 3-5 hours . Make a list of non-food related activities . Aim for 64-100 ounces of FLUID daily  . Aim for at least 60-80 grams of PROTEIN daily . Look for a liquid protein source that contain ?15 g protein and ?5 g carbohydrate  (ex: shakes, drinks, shots) . Physical activity is an important part of a healthy lifestyle so keep it moving!  Follow diet recommendations listed below Energy and Macronutrient Recommendations: Calories: 1800 Carbohydrate: 200 Protein: 135 Fat: 50  Demonstrated degree of understanding via:  Teach Back   Teaching Method Utilized:  Visual Auditory Hands on  Barriers to learning/adherence to lifestyle change: none  Patient to call the Nutrition and Diabetes Education Services to enroll in Pre-Op and Post-Op Nutrition Education when surgery date is scheduled.

## 2017-02-07 DIAGNOSIS — M1711 Unilateral primary osteoarthritis, right knee: Secondary | ICD-10-CM | POA: Diagnosis not present

## 2017-02-07 DIAGNOSIS — G8929 Other chronic pain: Secondary | ICD-10-CM | POA: Diagnosis not present

## 2017-02-07 DIAGNOSIS — M25561 Pain in right knee: Secondary | ICD-10-CM | POA: Diagnosis not present

## 2017-02-07 DIAGNOSIS — M23321 Other meniscus derangements, posterior horn of medial meniscus, right knee: Secondary | ICD-10-CM | POA: Diagnosis not present

## 2017-02-16 ENCOUNTER — Encounter: Payer: Self-pay | Admitting: Neurology

## 2017-02-17 ENCOUNTER — Encounter: Payer: 59 | Admitting: Skilled Nursing Facility1

## 2017-02-17 DIAGNOSIS — Z713 Dietary counseling and surveillance: Secondary | ICD-10-CM | POA: Diagnosis not present

## 2017-02-17 DIAGNOSIS — E669 Obesity, unspecified: Secondary | ICD-10-CM | POA: Diagnosis not present

## 2017-02-18 ENCOUNTER — Ambulatory Visit (INDEPENDENT_AMBULATORY_CARE_PROVIDER_SITE_OTHER): Payer: 59 | Admitting: Neurology

## 2017-02-18 ENCOUNTER — Encounter: Payer: Self-pay | Admitting: Skilled Nursing Facility1

## 2017-02-18 ENCOUNTER — Encounter: Payer: Self-pay | Admitting: Neurology

## 2017-02-18 VITALS — BP 112/75 | HR 70 | Ht 64.0 in | Wt 255.0 lb

## 2017-02-18 DIAGNOSIS — Z6841 Body Mass Index (BMI) 40.0 and over, adult: Secondary | ICD-10-CM | POA: Diagnosis not present

## 2017-02-18 DIAGNOSIS — G4710 Hypersomnia, unspecified: Secondary | ICD-10-CM | POA: Diagnosis not present

## 2017-02-18 DIAGNOSIS — G4730 Sleep apnea, unspecified: Secondary | ICD-10-CM | POA: Diagnosis not present

## 2017-02-18 NOTE — Patient Instructions (Signed)
Bariatric Surgery Information Bariatric surgery, also called weight loss surgery, is a procedure that helps you lose weight. You may consider or your health care provider may suggest bariatric surgery if:  You are severely obese and have been unable to lose weight through diet and exercise.  You have health problems related to obesity, such as: ? Type 2 diabetes. ? Heart disease. ? Lung disease.  How does bariatric surgery help me lose weight? Bariatric surgery helps you lose weight by decreasing how much food your body absorbs. This is done by closing off part of your stomach to make it smaller. This restricts the amount of food your stomach can hold. Bariatric surgery can also change your body's regular digestive process, so that food bypasses the parts of your body that absorb calories and nutrients. If you decide to have bariatric surgery, it is important to continue to eat a healthy diet and exercise regularly after the surgery. What are the different kinds of bariatric surgery? There are two kinds of bariatric surgeries:  Restrictive surgeries make your stomach smaller. They do not change your digestive process. The smaller the size of your new stomach, the less food you can eat. There are different types of restrictive surgeries.  Malabsorptive surgeries both make your stomach smaller and alter your digestive process so that your body processes less calories and nutrients. These are the most common kind of bariatric surgery. There are different types of malabsorptive surgeries.  What are the different types of restrictive surgery? Adjustable Gastric Banding In this procedure, an inflatable band is placed around your stomach near the upper end. This makes the passageway for food into the rest of your stomach much smaller. The band can be adjusted, making it tighter or looser, by filling it with salt solution. Your surgeon can adjust the band based on how are you feeling and how much  weight you are losing. The band can be removed in the future. Vertical Banded Gastroplasty In this procedure, staples are used to separate your stomach into two parts, a small upper pouch and a bigger lower pouch. This decreases how much food you can eat. Sleeve Gastrectomy In this procedure, your stomach is made smaller. This is done by surgically removing a large part of your stomach. When your stomach is smaller, you feel full more quickly and reduce how much you eat. What are the different types of malabsorptive surgery? Roux-en-Y Gastric Bypass (RGB) This is the most common weight loss surgery. In this procedure, a small stomach pouch is created in the upper part of your stomach. Next, this small stomach pouch is attached directly to the middle part of your small intestine. The farther down your small intestine the new connection is made, the fewer calories and nutrients you will absorb. Biliopancreatic Diversion with Duodenal Switch (BPD/DS) This is a multi-step procedure. In this procedure, a large part of your stomach is removed, making your stomach smaller. Next, this smaller stomach is attached to the lower part of your small intestine. Like the RGB surgery, you absorb fewer calories and nutrients the farther down your small intestine the attachment is made. What are the risks of bariatric surgery? As with any surgical procedure, each type of bariatric surgery has its own risks. These risks also depend on your age, your overall health, and any other medical conditions you may have. When deciding on bariatric surgery, it is very important to:  Talk to your health care provider and choose the surgery that is best for   you.  Ask your health care provider about specific risks for the surgery you choose.  Where to find more information:  American Society for Metabolic & Bariatric Surgery: www.asmbs.org  Weight-control Information Network (WIN): win.niddk.nih.gov This information is not  intended to replace advice given to you by your health care provider. Make sure you discuss any questions you have with your health care provider. Document Released: 04/22/2005 Document Revised: 09/28/2015 Document Reviewed: 10/21/2012 Elsevier Interactive Patient Education  2017 [employer]  

## 2017-02-18 NOTE — Progress Notes (Signed)
SLEEP MEDICINE CLINIC   Provider:  Larey Seat, M D  Primary Care Physician:    Referring Provider: Greer Pickerel, MD    Chief Complaint  Patient presents with  . New Patient (Initial Visit)    pt alone, rm 10. pt wakes up multiple times during the night. pt has been told that she snores and she gasp for air.     HPI:  Cheryl Craig is a 51 y.o. female , seen here as in a referral  from Dr. [name] for a sleep study in preparation for weight loss surgery.   I have the pleasure of meeting Cheryl Craig today on 02/18/2017, she is a 51 year old African-American left-handed female patient anticipating a gastric sleeve bariatric surgery. The bariatric surgeon, Dr. Greer Pickerel, referred the patient to today's consultation as she has reported sleepiness, snoring and restless legs, she also suffers from joint pain and swelling. She is employed through [employer]. She has thought about weight loss surgery since April 2017, her PCP is Dr. Kelton Pillar, her main concern is fear of developing diabetes like many family members have. She also feels a restrained or limitation to her physical activity level. She is also right knee pain, she has struggled with her weight mainly in adulthood and after childbirth but used to be a slender high school and Electronics engineer.  She is a gravida 1 para 2, in perimenopause, her twins was born at maternal age of 83. has some lower back pain and radiculopathy. 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  Chief complaint according to patient : "I wake up freuqently, getting little and poor sleep" her husband has witnessed her snoring and her apnea.  Sleep habits are as follows: She usually goes to bed between 9:15 and 9:30 PM but it may take a while to go to sleep. Her bedroom is core quiet and dark. She has a bedroom with her husband. Her husband works irregular hours as a Geographical information systems officer, when at home he will go to bed around 11  PM. She feels that she is not getting deep and refreshing sleep and wakes up very easily. Her sleep feels more like dozing. She sleeps usually on her side, on 2 pillows. She used to have a lot of acid reflex but not of the last 2 or 3 months. She wakes up frequently and has the feeling that she is to go to the bathroom but usually doesn't have to go. She reports neither vivid dreams nor nightmares, she's not a sleep walker or sleep talker. She rises very early at 3:30 AM, she wakes spontaneously up at that time it is done for the night. She may get at best 6 hours of sleep but likely less. She used to have perimenopausal hot flushing but now she is free of those for about 2 or 3 months. Last period in July.  Sleep medical history and family sleep history:  DM in both parents, mother was an  insomniac with restess sleep, snoring, up and down all night, she died age 76.  She did not have tonsillectomy, any nasal or neck surgery, no TBI history.   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  Non smoker- never used tobacco. Drinks alcohol 6 a year or less. Caffeine - coffee 2-3 a day, tea none- no energy drinks, sodas- none.  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   Review of Systems: Out of a complete 14 system review, the patient complains of only the following symptoms, and all other reviewed systems are negative.  Snoring, witnessed apnea. Insomnia, fragmented sleep, no nocturia.    Epworth score 11/ 24  , Fatigue severity score 43  , depression score n/a    Social History   Social History  . Marital status: Married    Spouse name: N/A  . Number of children: N/A  . Years of education: N/A   Occupational History  . Not on file.   Social History Main Topics  . Smoking status: Never Smoker  . Smokeless tobacco: Never Used  . Alcohol use No  . Drug use: No  . Sexual activity: Not on file    Other Topics Concern  . Not on file   Social History Narrative  . No narrative on file    Family History  Problem Relation Age of Onset  . Arthritis Mother   . Diabetes Mother   . Migraines Father   . Stroke Other     Past Medical History:  Diagnosis Date  . GERD (gastroesophageal reflux disease)   . Obesity   . Plantar fasciitis     Past Surgical History:  Procedure Laterality Date  . CESAREAN SECTION      Current Outpatient Prescriptions  Medication Sig Dispense Refill  . acetaminophen (TYLENOL) 325 MG tablet Take 650 mg by mouth every 6 (six) hours as needed for moderate pain or fever.    Marland Kitchen ibuprofen (ADVIL,MOTRIN) 200 MG tablet Take 600 mg by mouth every 6 (six) hours as needed for headache or moderate pain.     No current facility-administered medications for this visit.     Allergies as of 02/18/2017  . (No Known Allergies)    Vitals: Ht 5\' 4"  (1.626 m)   Wt 255 lb (115.7 kg)   BMI 43.77 kg/m  Last Weight:  Wt Readings from Last 1 Encounters:  02/18/17 255 lb (115.7 kg)   YQI:HKVQ mass index is 43.77 kg/m.     Last Height:   Ht Readings from Last 1 Encounters:  02/18/17 5\' 4"  (1.626 m)    Physical exam:  General: The patient is awake, alert and appears not in acute distress. The patient is well groomed. Head: Normocephalic, atraumatic. Neck is supple. Mallampati 3 neck circumference:15. Nasal airflow patent ,  Retrognathia is seen.  Cardiovascular:  Regular rate and rhythm, without  murmurs or carotid bruit, and without distended neck veins. Respiratory: Lungs are clear to auscultation. Skin:  Without evidence of edema, or rash Trunk: BMI is 44, The patient's posture is erect   Neurologic exam : The patient is awake and alert, oriented to place and time.   Memory subjective described as intact.  Attention span & concentration ability appears normal.  Speech is fluent,  without dysarthria, dysphonia or aphasia.  Mood and affect are  appropriate.  Cranial nerves: Pupils are equal and briskly reactive to light. Funduscopic exam without evidence of pallor or edema. Extraocular movements  in vertical and horizontal planes intact and without nystagmus. Visual fields by finger perimetry are intact. Hearing to finger rub intact.  Facial sensation intact to fine touch. Facial motor strength is symmetric and tongue and uvula move midline. Shoulder shrug was symmetrical.   Motor exam: Normal tone, muscle bulk and symmetric strength in all extremities. Sensory:  Fine touch, pinprick and vibration were tested in all extremities. Proprioception  tested in the upper extremities was normal. Coordination: Rapid alternating movements in the fingers/hands was normal. Finger-to-nose maneuver  normal without evidence of ataxia, dysmetria or tremor. Gait and station: Patient walks without assistive device  Turns with 3 Steps.  Deep tendon reflexes: in the  upper and lower extremities are symmetric and intact.   Assessment:  After physical and neurologic examination, review of laboratory studies,  Personal review of imaging studies, reports of other /same  Imaging studies, results of polysomnography and / or neurophysiology testing and pre-existing records as far as provided in visit., my assessment is   1)  Mrs. Chism is at this time considered morbidly obese with a body mass index of over 40, airway is narrow,  but she lacks severe comorbidities such as diabetes, coronary artery disease, malignant hypertension, congestive heart failure. She is overall healthy except for her weight. In order to prepare her for a gastric sleeve surgery it needs to be determined if she has apnea to what degree and how to treat it. For this reason I will order a split night polysomnography. I would benefit is to diagnose the apnea treated right away, which cannot be done in a home sleep test. If her insurance insists on a home sleep test I will asked to use our  new watch  like  sleep apnea screening device   The patient was advised of the nature of the diagnosed disorder , the treatment options and the  risks for general health and wellness arising from not treating the condition.   I spent more than  35 minutes of face to face time with the patient.  Greater than 50% of time was spent in counseling and coordination of care. We have discussed the diagnosis and differential and I answered the patient's questions.    Plan:  Treatment plan and additional workup :   SPLIT night PSG with Split at [facility] , give oxygen if necessary,   Larey Seat, MD 71/10/2692, 8:54 PM  Certified in Neurology by ABPN Certified in [place] by [facility] [address] [place], [place] [address]

## 2017-02-18 NOTE — Progress Notes (Signed)
Pre-Operative Nutrition Class:  Appt start time: 7412   End time:  1830.  Patient was seen on 02/17/2017 for Pre-Operative Bariatric Surgery Education at [facility].   Surgery date:  Surgery type: sleeve Start weight at [facility]: 255.9 Weight today: 252.6  Samples given per MNT protocol. Patient educated on appropriate usage: Bariatric Advantage Multivitamin Lot # [id number] Exp: 07/19  Bariatric Advantage Calcium  Lot # [id number] Exp: nov-06-2016  Renee Pain Protein  Shake Lot # [id number] Exp: Sep 30, 2017 The following the learning objectives were met by the patient during this course:  Identify Pre-Op Dietary Goals and will begin 2 weeks pre-operatively  Identify appropriate sources of fluids and proteins   State protein recommendations and appropriate sources pre and post-operatively  Identify Post-Operative Dietary Goals and will follow for 2 weeks post-operatively  Identify appropriate multivitamin and calcium sources  Describe the need for physical activity post-operatively and will follow MD recommendations  State when to call healthcare provider regarding medication questions or post-operative complications  Handouts given during class include:  Pre-Op Bariatric Surgery Diet Handout  Protein Shake Handout  Post-Op Bariatric Surgery Nutrition Handout  BELT Program Information Flyer  Support Group Information Flyer  [facility] Bariatric Supplements Price List  Follow-Up Plan: [facility] for diet advancement per MD.

## 2017-02-19 ENCOUNTER — Ambulatory Visit (INDEPENDENT_AMBULATORY_CARE_PROVIDER_SITE_OTHER): Payer: 59 | Admitting: Neurology

## 2017-02-19 DIAGNOSIS — G4710 Hypersomnia, unspecified: Secondary | ICD-10-CM

## 2017-02-19 DIAGNOSIS — Z6841 Body Mass Index (BMI) 40.0 and over, adult: Principal | ICD-10-CM

## 2017-02-19 DIAGNOSIS — R0683 Snoring: Secondary | ICD-10-CM

## 2017-02-19 DIAGNOSIS — G4719 Other hypersomnia: Secondary | ICD-10-CM

## 2017-02-21 ENCOUNTER — Telehealth: Payer: Self-pay | Admitting: Neurology

## 2017-02-21 DIAGNOSIS — G4719 Other hypersomnia: Secondary | ICD-10-CM

## 2017-02-21 DIAGNOSIS — R0683 Snoring: Secondary | ICD-10-CM

## 2017-02-21 DIAGNOSIS — Z6841 Body Mass Index (BMI) 40.0 and over, adult: Principal | ICD-10-CM

## 2017-02-21 DIAGNOSIS — E559 Vitamin D deficiency, unspecified: Secondary | ICD-10-CM

## 2017-02-21 NOTE — Procedures (Signed)
PATIENT'S NAME:  Cheryl Craig, Cheryl Craig DOB:      1965/10/15      MR#:    [id number]     DATE OF RECORDING: 02/19/2017 REFERRING M.D.:  Kelton Pillar MD Study Performed:   Baseline Polysomnogram HISTORY:   Cheryl Craig is a 51 year old African-American, left-handed female patient anticipating a gastric sleeve bariatric surgery. The bariatric surgeon, Dr. Greer Pickerel, referred the patient as she has reported sleepiness, snoring and restless legs, she also suffers from joint pain and swelling. She is employed through [employer]. She has thought about weight loss surgery since April 2017, her PCP is Dr. Kelton Pillar; her main concern is fear of developing diabetes like many family members have. Reports some lower back pain and radiculopathy.   The patient endorsed the Epworth Sleepiness Scale at 11/24 points.   The patient's weight 255 pounds with a height of 64 (inches), resulting in a BMI of 43.7 kg/m2. The patient's neck circumference measured 15 inches.  CURRENT MEDICATIONS: Acetaminophen and Ibuprofen   PROCEDURE:  This is a multichannel digital polysomnogram utilizing the Somnostar 11.2 system.  Electrodes and sensors were applied and monitored per AASM Specifications.   EEG, EOG, Chin and Limb EMG, were sampled at 200 Hz.  ECG, Snore and Nasal Pressure, Thermal Airflow, Respiratory Effort, CPAP Flow and Pressure, Oximetry was sampled at 50 Hz. Digital video and audio were recorded.      BASELINE STUDY  Lights Out was at 21:11 and Lights On at 05:03.  Total recording time (TRT) was 472.5 minutes, with a total sleep time (TST) of 366 minutes.   The patient's sleep latency was 76.5 minutes.  REM latency was 70 minutes.  The sleep efficiency was 77.5 %.     SLEEP ARCHITECTURE: WASO (Wake after sleep onset) was 30 minutes.  There were 13.5 minutes in Stage N1, 274 minutes Stage N2, 15.5 minutes Stage N3 and 63 minutes in Stage REM.  The percentage of Stage N1 was 3.7%, Stage N2 was 74.9%, Stage N3 was  4.2% and Stage R (REM sleep) was 17.2%.    RESPIRATORY ANALYSIS:  There were a total of 34 respiratory events:  0 apneas and 34 hypopneas with 0 respiratory event related arousals (RERAs).     The total APNEA/HYPOPNEA INDEX (AHI) was 5.6/hour and the total RESPIRATORY DISTURBANCE INDEX was 5.6 /hour. 32 events occurred in REM sleep and 4 events in NREM. The REM AHI was 30.5 /hour, versus a non-REM AHI of 0.4. The patient spent 61.5 minutes of total sleep time in the supine position and 305 minutes in non-supine. The supine AHI was 11.7 versus a non-supine AHI of 4.3.  OXYGEN SATURATION & C02:  The Wake baseline 02 saturation was 98%, with the lowest being 80%. Time spent below 89% saturation equaled 4 minutes.    PERIODIC LIMB MOVEMENTS:   The patient had a total of 0 Periodic Limb Movements.  The arousals were noted as: 24 were spontaneous, 0 were associated with PLMs, and 33 were associated with respiratory events.  Audio and video analysis did not show any abnormal or unusual movements, behaviors, phonations or vocalizations.  Snoring was noted. No nocturia observed.  EKG was in keeping with normal sinus rhythm (NSR).  Post-study, the patient indicated that sleep was the same as usual.   IMPRESSION:  1. Very mild Obstructive Sleep Apnea (OSA), not in need of CPAP therapy.  2. Primary Snoring, mostly in supine sleep position.  RECOMMENDATIONS: 1. Positional therapy is advised, with  the goal to avoid supine sleep. 2. Avoid sedative-hypnotics which may worsen sleep apnea, including alcohol and tobacco (as applicable). 3. Weight loss will most likely treat the mild apnea and snoring (BMI 44). 4. A follow up appointment is not necessary, as we did not recommend any intervention for this patient. It can be scheduled in the Sleep Clinic at [facility] if the patient desires. The referring provider will be notified of the results.      I certify that I have reviewed the entire  raw data recording prior to the issuance of this report in accordance with the Standards of Accreditation of the American Academy of Sleep Medicine (AASM)     Larey Seat, MD      02-21-2017  Diplomat, American Board of Psychiatry and Neurology  Diplomat, American Board of Sleep Medicine Medical Director of [facility] [employer]

## 2017-02-21 NOTE — Telephone Encounter (Signed)
Needed to create and release order for SPLIT study from 02-19-2017 . CD

## 2017-02-24 ENCOUNTER — Telehealth: Payer: Self-pay | Admitting: Neurology

## 2017-02-24 NOTE — Telephone Encounter (Signed)
-----   Message from Larey Seat, MD sent at 02/21/2017 11:49 AM EDT ----- Kermit Balo News. No intervention necessary- her upcoming surgery should address the  Mild apnea and snoring (Not severe enough for CPAP).   Cc Dr Greer Pickerel CD

## 2017-02-24 NOTE — Telephone Encounter (Signed)
Called the pt and let her know about the sleep study. Dr Brett Fairy stated that she had very mild apnea and didn't feel it was needed to treat with CPAP. Pt snored on her back primarily, so I encouraged her to sleep on her side and avoid the use of hypnotics. Pt verbalized understanding and had no further questions

## 2017-02-25 ENCOUNTER — Ambulatory Visit
Admission: RE | Admit: 2017-02-25 | Discharge: 2017-02-25 | Disposition: A | Discharge status: Home / Self Care | Payer: 59 | Source: Ambulatory Visit | Attending: Family Medicine | Admitting: Family Medicine

## 2017-02-25 DIAGNOSIS — Z1231 Encounter for screening mammogram for malignant neoplasm of breast: Secondary | ICD-10-CM

## 2017-03-01 ENCOUNTER — Ambulatory Visit (INDEPENDENT_AMBULATORY_CARE_PROVIDER_SITE_OTHER): Payer: 59 | Admitting: Family Medicine

## 2017-03-01 ENCOUNTER — Encounter: Payer: Self-pay | Admitting: Family Medicine

## 2017-03-01 VITALS — BP 104/76 | HR 68 | Temp 98.4°F | Resp 16 | Ht 64.0 in | Wt 254.0 lb

## 2017-03-01 DIAGNOSIS — R252 Cramp and spasm: Secondary | ICD-10-CM | POA: Diagnosis not present

## 2017-03-01 NOTE — Patient Instructions (Addendum)
1. Drink more water 2. 1 ounce of pickle juice 2-3 times a day  Leg Cramps Leg cramps occur when a muscle or muscles tighten and you have no control over this tightening (involuntary muscle contraction). Muscle cramps can develop in any muscle, but the most common place is in the calf muscles of the leg. Those cramps can occur during exercise or when you are at rest. Leg cramps are painful, and they may last for a few seconds to a few minutes. Cramps may return several times before they finally stop. Usually, leg cramps are not caused by a serious medical problem. In many cases, the cause is not known. Some common causes include:  Overexertion.  Overuse from repetitive motions, or doing the same thing over and over.  Remaining in a certain position for a long period of time.  Improper preparation, form, or technique while performing a sport or an activity.  Dehydration.  Injury.  Side effects of some medicines.  Abnormally low levels of the salts and ions in your blood (electrolytes), especially potassium and calcium. These levels could be low if you are taking water pills (diuretics) or if you are pregnant.  Follow these instructions at home: Watch your condition for any changes. Taking the following actions may help to lessen any discomfort that you are feeling:  Stay well-hydrated. Drink enough fluid to keep your urine clear or pale yellow.  Try massaging, stretching, and relaxing the affected muscle. Do this for several minutes at a time.  For tight or tense muscles, use a warm towel, heating pad, or hot shower water directed to the affected area.  If you are sore or have pain after a cramp, applying ice to the affected area may relieve discomfort. ? Put ice in a plastic bag. ? Place a towel between your skin and the bag. ? Leave the ice on for 20 minutes, 2-3 times per day.  Avoid strenuous exercise for several days if you have been having frequent leg cramps.  Make sure  that your diet includes the essential minerals for your muscles to work normally.  Take medicines only as directed by your health care provider.  Contact a health care provider if:  Your leg cramps get more severe or more frequent, or they do not improve over time.  Your foot becomes cold, numb, or blue. This information is not intended to replace advice given to you by your health care provider. Make sure you discuss any questions you have with your health care provider. Document Released: 05/30/2004 Document Revised: 09/28/2015 Document Reviewed: 03/30/2014 Elsevier Interactive Patient Education  2018 [employer]. .    IF you received an x-ray today, you will receive an invoice from [facility]. Please contact [facility] at [phone] with questions or concerns regarding your invoice.   IF you received labwork today, you will receive an invoice from [facility]. Please contact [facility] at [phone] with questions or concerns regarding your invoice.   Our billing staff will not be able to assist you with questions regarding bills from these companies.  You will be contacted with the lab results as soon as they are available. The fastest way to get your results is to activate your My Chart account. Instructions are located on the last page of this paperwork. If you have not heard from [place] regarding the results in 2 weeks, please contact this office.

## 2017-03-01 NOTE — Progress Notes (Signed)
   10/27/20184:02 PM  Cheryl Craig [birth year], 51 y.o. female [id number]  Chief Complaint  Patient presents with  . Leg Pain    bilateral leg cramping when sleeping    HPI:   Patient is a 51 y.o. female who presents today for about 2 weeks of worsening nocturnal leg cramps. Has not been exercising of recent due to right knee OA. Hardly drinks any water.   Depression screen [facility] 2/9 03/01/2017 02/06/2017 10/24/2016  Decreased Interest 0 0 0  Down, Depressed, Hopeless 0 0 0  PHQ - 2 Score 0 0 0    No Known Allergies  Prior to Admission medications   Medication Sig Start Date End Date Taking? Authorizing Provider  acetaminophen (TYLENOL) 325 MG tablet Take 650 mg by mouth every 6 (six) hours as needed for moderate pain or fever.   Yes [provider]  ibuprofen (ADVIL,MOTRIN) 200 MG tablet Take 600 mg by mouth every 6 (six) hours as needed for headache or moderate pain.   Yes [provider]    Past Medical History:  Diagnosis Date  . GERD (gastroesophageal reflux disease)   . Obesity   . Plantar fasciitis     Past Surgical History:  Procedure Laterality Date  . CESAREAN SECTION      Social History  Substance Use Topics  . Smoking status: Never Smoker  . Smokeless tobacco: Never Used  . Alcohol use No    Family History  Problem Relation Age of Onset  . Arthritis Mother   . Diabetes Mother   . Migraines Father   . Stroke Other     ROS Per HPI  OBJECTIVE:  Blood pressure 104/76, pulse 68, temperature 98.4 F (36.9 C), temperature source Oral, resp. rate 16, height 5\' 4"  (1.626 m), weight 254 lb (115.2 kg), last menstrual period 06/06/2016, SpO2 98 %.  Physical Exam  Constitutional: She is oriented to person, place, and time and well-developed, well-nourished, and in no distress.  HENT:  Head: Normocephalic and atraumatic.  Mouth/Throat: Oropharynx is clear and moist. No oropharyngeal exudate.  Eyes: Pupils are equal, round, and  reactive to light. EOM are normal. No scleral icterus.  Neck: Neck supple.  Cardiovascular: Normal rate, regular rhythm, normal heart sounds and intact distal pulses.  Exam reveals no gallop and no friction rub.   No murmur heard. Pulmonary/Chest: Effort normal and breath sounds normal. She has no wheezes. She has no rales.  Musculoskeletal: She exhibits no edema or tenderness.  Neurological: She is alert and oriented to person, place, and time. Gait normal.  Skin: Skin is warm and dry.     ASSESSMENT and PLAN  1. Nocturnal muscle cramps Discussed improve hydration, trial of pickle juice, gentle stretching. Patient educational handout given. - Basic Metabolic Panel - Magnesium - Vitamin D, 25-hydroxy  Return if symptoms worsen or fail to improve.    Rutherford Guys, MD Primary Care at [facility], [place] [address] Ph.  [phone] Fax [phone]

## 2017-03-03 LAB — BASIC METABOLIC PANEL
BUN/Creatinine Ratio: 20 (ref 9–23)
BUN: 18 mg/dL (ref 6–24)
CO2: 23 mmol/L (ref 20–29)
Calcium: 9.6 mg/dL (ref 8.7–10.2)
Chloride: 104 mmol/L (ref 96–106)
Creatinine, Ser: 0.9 mg/dL (ref 0.57–1.00)
GFR calc Af Amer: 87 mL/min/{1.73_m2} (ref >59)
GFR calc non Af Amer: 75 mL/min/{1.73_m2} (ref >59)
Glucose: 95 mg/dL (ref 65–99)
Potassium: 4.4 mmol/L (ref 3.5–5.2)
Sodium: 141 mmol/L (ref 134–144)

## 2017-03-03 LAB — MAGNESIUM: Magnesium: 2 mg/dL (ref 1.6–2.3)

## 2017-03-03 LAB — VITAMIN D 25 HYDROXY (VIT D DEFICIENCY, FRACTURES): Vit D, 25-Hydroxy: 13.9 ng/mL — ABNORMAL LOW (ref 30.0–100.0)

## 2017-03-04 DIAGNOSIS — E559 Vitamin D deficiency, unspecified: Secondary | ICD-10-CM | POA: Insufficient documentation

## 2017-03-04 MED ORDER — VITAMIN D (ERGOCALCIFEROL) 1.25 MG (50000 UNIT) PO CAPS: 50000.0000 [IU] | ORAL_CAPSULE | ORAL | 0 refills | Status: DC

## 2017-03-04 MED FILL — VIT D2 1.25 MG (50,000 UNIT: 1.25 MG | 84 days supply | Qty: 12 | Fill #0

## 2017-03-05 NOTE — Telephone Encounter (Signed)
Spoke with pt.  She is having "sleeve" surgery next month.  Advised her to call surgeon and advise what she is on and make sure they do not want her to hold while having surgery.  Gave pt details to relay to surgeon.  Pt appreciative.

## 2017-03-13 ENCOUNTER — Ambulatory Visit: Payer: Self-pay | Admitting: General Surgery

## 2017-03-13 DIAGNOSIS — E559 Vitamin D deficiency, unspecified: Secondary | ICD-10-CM | POA: Diagnosis not present

## 2017-03-13 DIAGNOSIS — G4733 Obstructive sleep apnea (adult) (pediatric): Secondary | ICD-10-CM | POA: Diagnosis not present

## 2017-03-13 DIAGNOSIS — R7303 Prediabetes: Secondary | ICD-10-CM | POA: Diagnosis not present

## 2017-03-13 DIAGNOSIS — M545 Low back pain: Secondary | ICD-10-CM | POA: Diagnosis not present

## 2017-03-13 DIAGNOSIS — K219 Gastro-esophageal reflux disease without esophagitis: Secondary | ICD-10-CM | POA: Diagnosis not present

## 2017-03-13 DIAGNOSIS — G8929 Other chronic pain: Secondary | ICD-10-CM | POA: Diagnosis not present

## 2017-03-13 DIAGNOSIS — M722 Plantar fascial fibromatosis: Secondary | ICD-10-CM | POA: Diagnosis not present

## 2017-03-13 DIAGNOSIS — M170 Bilateral primary osteoarthritis of knee: Secondary | ICD-10-CM | POA: Diagnosis not present

## 2017-03-13 NOTE — H&P (View-Only) (Signed)
Cheryl Craig 03/13/2017 4:05 PM Location: [facility] Patient #: [id number] DOB: 01-21-1966 Married / Language: English / Race: Black or African American Female  History of Present Illness Cheryl Craig M. Cheryl Riedinger MD; 03/13/2017 4:31 PM) The patient is a 51 year old female who presents for a preoperative evaluation. She comes in today for her preoperative appointment. I initially saw her back in follow-up in September. She denies any changes since she was initially seen. She did undergo a sleep study which showed mild obstructive sleep apnea not severe enough her CPAP. She'll was also found to have vitamin D deficiency and is on weekly vitamin D supplementation. Her upper GI, chest x-ray and mammogram were unremarkable. She was also found to be prediabetic otherwise her bariatric evaluation labs were unremarkable. She denies any chest pain, chest pressure, source of breath, dyspnea on exertion, orthopnea, melena or hematochezia. She denies any dysuria. She does have some general questions about anesthesia.   Problem List/Past Medical Cheryl Craig M. [name], MD; 03/13/2017 4:31 PM) CHRONIC MIDLINE LOW BACK PAIN WITHOUT SCIATICA (M54.5) PLANTAR FASCIITIS, BILATERAL (M72.2) OBESITY (BMI 30-39.9) GASTROESOPHAGEAL REFLUX DISEASE, ESOPHAGITIS PRESENCE NOT SPECIFIED (K21.9) PREDIABETES (R73.03) MORBID OBESITY (E66.01) MILD OBSTRUCTIVE SLEEP APNEA (G47.33) VITAMIN D DEFICIENCY (E55.9)  Past Surgical History Cheryl Craig M. [name], MD; 03/13/2017 4:31 PM) Cesarean Section - 1  Diagnostic Studies History Cheryl Craig M. [name], MD; 03/13/2017 4:31 PM) Colonoscopy never Mammogram 1-3 years ago within last year Pap Smear 1-5 years ago  Allergies (Cheryl Craig Cheryl, Rafter J Craig; 03/13/2017 4:06 PM) No Known Drug Allergies 08/09/2015 Allergies Reconciled  Medication History Cheryl Craig M. [name], MD; 03/13/2017 4:31 PM) Vitamin D (Ergocalciferol) (50000UNIT Capsule, Oral) Active. Acetaminophen (650MG  Tablet,  Oral) Active. Ibuprofen (600MG  Tablet, Oral) Active. Multivitamins (Oral) Active. Benefiber (Oral) Specific strength unknown - Active. Medications Reconciled OxyCODONE HCl (5MG /5ML Solution, 5-10 Milliliter Oral every four hours, as needed, Taken starting 03/13/2017) Active. Zofran ODT (4MG  Tablet Disint, 1 (one) Tablet Oral every six hours, as needed, Taken starting 03/13/2017) Active. (As needed for nasuea) Protonix (40MG  Tablet DR, 1 (one) Tablet Oral daily, Taken starting 03/13/2017) Active.  Social History Cheryl Craig M. [name], MD; 03/13/2017 4:31 PM) Alcohol use Occasional alcohol use. Caffeine use Coffee. No drug use Tobacco use Never smoker.  Family History Cheryl Craig M. [name], MD; 03/13/2017 4:31 PM) Arthritis Mother. Arthritis Mother. Diabetes Mellitus Mother. Migraine Headache Father.  Pregnancy / Birth History Cheryl Craig M. [name], MD; 03/13/2017 4:31 PM) Age at menarche 7 years. Contraceptive History Oral contraceptives. Gravida 1 Irregular periods Maternal age 76-35 Para 2 Regular periods  Other Problems Cheryl Craig M. [name], MD; 03/13/2017 4:31 PM) Back Pain Gastroesophageal Reflux Disease BILATERAL PRIMARY OSTEOARTHRITIS OF KNEE (M17.0)     Review of Systems Cheryl Craig M. Cheryl Whitehead MD; 03/13/2017 4:29 PM) General Present- Weight Gain. Not Present- Appetite Loss, Chills, Fatigue, Fever, Night Sweats and Weight Loss. Skin Not Present- Change in Wart/Mole, Dryness, Hives, Jaundice, New Lesions, Non-Healing Wounds, Rash and Ulcer. HEENT Present- Wears glasses/contact lenses. Not Present- Earache, Hearing Loss, Hoarseness, Nose Bleed, Oral Ulcers, Ringing in the Ears, Seasonal Allergies, Sinus Pain, Sore Throat, Visual Disturbances and Yellow Eyes. Respiratory Not Present- Bloody sputum, Chronic Cough, Difficulty Breathing, Snoring and Wheezing. Breast Not Present- Breast Mass, Breast Pain, Nipple Discharge and Skin Changes. Cardiovascular Not Present- Chest Pain,  Difficulty Breathing Lying Down, Leg Cramps, Palpitations, Rapid Heart Rate, Shortness of Breath and Swelling of Extremities. Gastrointestinal Not Present- Abdominal Pain, Bloating, Bloody Stool, Change in Bowel Habits, Chronic diarrhea, Constipation, Difficulty Swallowing, Excessive gas, Gets  full quickly at meals, Hemorrhoids, Indigestion, Nausea, Rectal Pain and Vomiting. Female Genitourinary Not Present- Frequency, Nocturia, Painful Urination, Pelvic Pain and Urgency. Musculoskeletal Present- Back Pain, Joint Pain, Joint Stiffness and Swelling of Extremities. Not Present- Muscle Pain and Muscle Weakness. Neurological Not Present- Decreased Memory, Fainting, Headaches, Numbness, Seizures, Tingling, Tremor, Trouble walking and Weakness. Psychiatric Not Present- Anxiety, Bipolar, Change in Sleep Pattern, Depression, Fearful and Frequent crying. Endocrine Not Present- Cold Intolerance, Excessive Hunger, Hair Changes, Heat Intolerance, Hot flashes and New Diabetes. Hematology Not Present- Blood Thinners, Easy Bruising, Excessive bleeding, Gland problems, HIV and Persistent Infections.  Vitals (Cheryl A. Brown RMA; 03/13/2017 4:06 PM) 03/13/2017 4:05 PM Weight: 260.8 lb Height: 65in Body Surface Area: 2.21 m Body Mass Index: 43.4 kg/m  Temp.: 98.13F  Pulse: 88 (Regular)  BP: 122/86 (Sitting, Left Arm, Standard)      Physical Exam Cheryl Craig M. Iktan Aikman MD; 03/13/2017 4:29 PM)  General Mental Status-Alert. General Appearance-Consistent with stated age. Hydration-Well hydrated. Voice-Normal. Note: morbidly obese  Head and Neck Head-normocephalic, atraumatic with no lesions or palpable masses. Trachea-midline. Thyroid Gland Characteristics - normal size and consistency.  Eye Eyeball - Bilateral-Extraocular movements intact. Sclera/Conjunctiva - Bilateral-No scleral icterus.  ENMT Ears -Note:normal externa ears.  Mouth and Throat -Note:lips  intact.   Chest and Lung Exam Chest and lung exam reveals -quiet, even and easy respiratory effort with no use of accessory muscles and on auscultation, normal breath sounds, no adventitious sounds and normal vocal resonance. Inspection Chest Wall - Normal. Back - normal.  Breast - Did not examine.  Cardiovascular Cardiovascular examination reveals -normal heart sounds, regular rate and rhythm with no murmurs and normal pedal pulses bilaterally.  Abdomen Inspection Inspection of the abdomen reveals - No Hernias. Skin - Scar - no surgical scars. Palpation/Percussion Palpation and Percussion of the abdomen reveal - Soft, Non Tender, No Rebound tenderness, No Rigidity (guarding) and No hepatosplenomegaly. Auscultation Auscultation of the abdomen reveals - Bowel sounds normal.  Peripheral Vascular Upper Extremity Palpation - Pulses bilaterally normal.  Neurologic Neurologic evaluation reveals -alert and oriented x 3 with no impairment of recent or remote memory. Mental Status-Normal.  Neuropsychiatric The patient's mood and affect are described as -normal. Judgment and Insight-insight is appropriate concerning matters relevant to self.  Musculoskeletal Normal Exam - Left-Upper Extremity Strength Normal and Lower Extremity Strength Normal. Normal Exam - Right-Upper Extremity Strength Normal and Lower Extremity Strength Normal. Note: mild bilateral knee crepitus  Lymphatic Head & Neck  General Head & Neck Lymphatics: Bilateral - Description - Normal. Axillary - Did not examine. Femoral & Inguinal - Did not examine.    Assessment & Plan Cheryl Craig M. Iracema Lanagan MD; 03/13/2017 4:29 PM)  MORBID OBESITY (E66.01) Impression: We discussed her preoperative workup. We discussed the results of her upper GI, chest x-ray, mammogram, sleep study and blood work. We rediscussed the typical postoperative and preoperative course. We discussed the preoperative meal plan. She was  given her postoperative prescriptions today. I encouraged her to contact the office should she have any additional questions between now and surgery.  Current Plans Pt Education - EMW_preopbariatric GASTROESOPHAGEAL REFLUX DISEASE, ESOPHAGITIS PRESENCE NOT SPECIFIED (K21.9) Impression: we also discussed laparoscopic adjustable gastric band surgery and it's followup and how it differs from [place]. We discussed the risks with LAGB surgery and the typical weight loss. after discussion, she is leaning toward sleeve. We discussed how sometimes sleeve gastrectomy can worsen reflux.  CHRONIC MIDLINE LOW BACK PAIN WITHOUT SCIATICA (M54.5)  PLANTAR FASCIITIS, BILATERAL (M72.2)  BILATERAL PRIMARY OSTEOARTHRITIS OF KNEE (M17.0)  MILD OBSTRUCTIVE SLEEP APNEA (G47.33) Impression: Sleep study showed mild obstructive sleep apnea not severe enough forCPAP  PREDIABETES (R73.03) Impression: We discussed the findings of prediabetes. We discussed that this should improve with weight loss surgery.  VITAMIN D DEFICIENCY (E55.9) Impression: I encouraged her to continue with the once a week vitamin D supplementation  Leighton Ruff. [name], MD, FACS General, Bariatric, & Minimally Invasive Surgery [facility], [place]

## 2017-03-13 NOTE — H&P (Signed)
Cheryl Craig 03/13/2017 4:05 PM Location: [facility] Patient #: [id number] DOB: 08-28-1965 Married / Language: English / Race: Black or African American Female  History of Present Illness Cheryl Hiss [name] MD; 03/13/2017 4:31 PM) The patient is a 51 year old female who presents for a preoperative evaluation. She comes in today for her preoperative appointment. I initially saw her back in follow-up in September. She denies any changes since she was initially seen. She did undergo a sleep study which showed mild obstructive sleep apnea not severe enough her CPAP. She'll was also found to have vitamin D deficiency and is on weekly vitamin D supplementation. Her upper GI, chest x-ray and mammogram were unremarkable. She was also found to be prediabetic otherwise her bariatric evaluation labs were unremarkable. She denies any chest pain, chest pressure, source of breath, dyspnea on exertion, orthopnea, melena or hematochezia. She denies any dysuria. She does have some general questions about anesthesia.   Problem List/Past Medical Cheryl Hiss M. [name], MD; 03/13/2017 4:31 PM) CHRONIC MIDLINE LOW BACK PAIN WITHOUT SCIATICA (M54.5) PLANTAR FASCIITIS, BILATERAL (M72.2) OBESITY (BMI 30-39.9) GASTROESOPHAGEAL REFLUX DISEASE, ESOPHAGITIS PRESENCE NOT SPECIFIED (K21.9) PREDIABETES (R73.03) MORBID OBESITY (E66.01) MILD OBSTRUCTIVE SLEEP APNEA (G47.33) VITAMIN D DEFICIENCY (E55.9)  Past Surgical History Cheryl Hiss M. [name], MD; 03/13/2017 4:31 PM) Cesarean Section - 1  Diagnostic Studies History Cheryl Hiss M. [name], MD; 03/13/2017 4:31 PM) Colonoscopy never Mammogram 1-3 years ago within last year Pap Smear 1-5 years ago  Allergies (Cheryl Craig, [place]; 03/13/2017 4:06 PM) No Known Drug Allergies 08/09/2015 Allergies Reconciled  Medication History Cheryl Hiss M. [name], MD; 03/13/2017 4:31 PM) Vitamin D (Ergocalciferol) (50000UNIT Capsule, Oral) Active. Acetaminophen (650MG  Tablet,  Oral) Active. Ibuprofen (600MG  Tablet, Oral) Active. Multivitamins (Oral) Active. Benefiber (Oral) Specific strength unknown - Active. Medications Reconciled OxyCODONE HCl (5MG /5ML Solution, 5-10 Milliliter Oral every four hours, as needed, Taken starting 03/13/2017) Active. Zofran ODT (4MG  Tablet Disint, 1 (one) Tablet Oral every six hours, as needed, Taken starting 03/13/2017) Active. (As needed for nasuea) Protonix (40MG  Tablet DR, 1 (one) Tablet Oral daily, Taken starting 03/13/2017) Active.  Social History Cheryl Hiss M. [name], MD; 03/13/2017 4:31 PM) Alcohol use Occasional alcohol use. Caffeine use Coffee. No drug use Tobacco use Never smoker.  Family History Cheryl Hiss M. [name], MD; 03/13/2017 4:31 PM) Arthritis Mother. Arthritis Mother. Diabetes Mellitus Mother. Migraine Headache Father.  Pregnancy / Birth History Cheryl Hiss M. [name], MD; 03/13/2017 4:31 PM) Age at menarche 45 years. Contraceptive History Oral contraceptives. Gravida 1 Irregular periods Maternal age 47-35 Para 2 Regular periods  Other Problems Cheryl Hiss M. [name], MD; 03/13/2017 4:31 PM) Back Pain Gastroesophageal Reflux Disease BILATERAL PRIMARY OSTEOARTHRITIS OF KNEE (M17.0)     Review of Systems Cheryl Hiss M. [name] MD; 03/13/2017 4:29 PM) General Present- Weight Gain. Not Present- Appetite Loss, Chills, Fatigue, Fever, Night Sweats and Weight Loss. Skin Not Present- Change in Wart/Mole, Dryness, Hives, Jaundice, New Lesions, Non-Healing Wounds, Rash and Ulcer. HEENT Present- Wears glasses/contact lenses. Not Present- Earache, Hearing Loss, Hoarseness, Nose Bleed, Oral Ulcers, Ringing in the Ears, Seasonal Allergies, Sinus Pain, Sore Throat, Visual Disturbances and Yellow Eyes. Respiratory Not Present- Bloody sputum, Chronic Cough, Difficulty Breathing, Snoring and Wheezing. Breast Not Present- Breast Mass, Breast Pain, Nipple Discharge and Skin Changes. Cardiovascular Not Present- Chest Pain,  Difficulty Breathing Lying Down, Leg Cramps, Palpitations, Rapid Heart Rate, Shortness of Breath and Swelling of Extremities. Gastrointestinal Not Present- Abdominal Pain, Bloating, Bloody Stool, Change in Bowel Habits, Chronic diarrhea, Constipation, Difficulty Swallowing, Excessive gas, Gets  full quickly at meals, Hemorrhoids, Indigestion, Nausea, Rectal Pain and Vomiting. Female Genitourinary Not Present- Frequency, Nocturia, Painful Urination, Pelvic Pain and Urgency. Musculoskeletal Present- Back Pain, Joint Pain, Joint Stiffness and Swelling of Extremities. Not Present- Muscle Pain and Muscle Weakness. Neurological Not Present- Decreased Memory, Fainting, Headaches, Numbness, Seizures, Tingling, Tremor, Trouble walking and Weakness. Psychiatric Not Present- Anxiety, Bipolar, Change in Sleep Pattern, Depression, Fearful and Frequent crying. Endocrine Not Present- Cold Intolerance, Excessive Hunger, Hair Changes, Heat Intolerance, Hot flashes and New Diabetes. Hematology Not Present- Blood Thinners, Easy Bruising, Excessive bleeding, Gland problems, HIV and Persistent Infections.  Vitals (Cheryl A. Brown RMA; 03/13/2017 4:06 PM) 03/13/2017 4:05 PM Weight: 260.8 lb Height: 65in Body Surface Area: 2.21 m Body Mass Index: 43.4 kg/m  Temp.: 98.28F  Pulse: 88 (Regular)  BP: 122/86 (Sitting, Left Arm, Standard)      Physical Exam Cheryl Hiss M. Maude Gloor MD; 03/13/2017 4:29 PM)  General Mental Status-Alert. General Appearance-Consistent with stated age. Hydration-Well hydrated. Voice-Normal. Note: morbidly obese  Head and Neck Head-normocephalic, atraumatic with no lesions or palpable masses. Trachea-midline. Thyroid Gland Characteristics - normal size and consistency.  Eye Eyeball - Bilateral-Extraocular movements intact. Sclera/Conjunctiva - Bilateral-No scleral icterus.  ENMT Ears -Note:normal externa ears.  Mouth and Throat -Note:lips  intact.   Chest and Lung Exam Chest and lung exam reveals -quiet, even and easy respiratory effort with no use of accessory muscles and on auscultation, normal breath sounds, no adventitious sounds and normal vocal resonance. Inspection Chest Wall - Normal. Back - normal.  Breast - Did not examine.  Cardiovascular Cardiovascular examination reveals -normal heart sounds, regular rate and rhythm with no murmurs and normal pedal pulses bilaterally.  Abdomen Inspection Inspection of the abdomen reveals - No Hernias. Skin - Scar - no surgical scars. Palpation/Percussion Palpation and Percussion of the abdomen reveal - Soft, Non Tender, No Rebound tenderness, No Rigidity (guarding) and No hepatosplenomegaly. Auscultation Auscultation of the abdomen reveals - Bowel sounds normal.  Peripheral Vascular Upper Extremity Palpation - Pulses bilaterally normal.  Neurologic Neurologic evaluation reveals -alert and oriented x 3 with no impairment of recent or remote memory. Mental Status-Normal.  Neuropsychiatric The patient's mood and affect are described as -normal. Judgment and Insight-insight is appropriate concerning matters relevant to self.  Musculoskeletal Normal Exam - Left-Upper Extremity Strength Normal and Lower Extremity Strength Normal. Normal Exam - Right-Upper Extremity Strength Normal and Lower Extremity Strength Normal. Note: mild bilateral knee crepitus  Lymphatic Head & Neck  General Head & Neck Lymphatics: Bilateral - Description - Normal. Axillary - Did not examine. Femoral & Inguinal - Did not examine.    Assessment & Plan Cheryl Hiss M. Ople Girgis MD; 03/13/2017 4:29 PM)  MORBID OBESITY (E66.01) Impression: We discussed her preoperative workup. We discussed the results of her upper GI, chest x-ray, mammogram, sleep study and blood work. We rediscussed the typical postoperative and preoperative course. We discussed the preoperative meal plan. She was  given her postoperative prescriptions today. I encouraged her to contact the office should she have any additional questions between now and surgery.  Current Plans Pt Education - EMW_preopbariatric GASTROESOPHAGEAL REFLUX DISEASE, ESOPHAGITIS PRESENCE NOT SPECIFIED (K21.9) Impression: we also discussed laparoscopic adjustable gastric band surgery and it's followup and how it differs from [place]. We discussed the risks with LAGB surgery and the typical weight loss. after discussion, she is leaning toward sleeve. We discussed how sometimes sleeve gastrectomy can worsen reflux.  CHRONIC MIDLINE LOW BACK PAIN WITHOUT SCIATICA (M54.5)  PLANTAR FASCIITIS, BILATERAL (M72.2)  BILATERAL PRIMARY OSTEOARTHRITIS OF KNEE (M17.0)  MILD OBSTRUCTIVE SLEEP APNEA (G47.33) Impression: Sleep study showed mild obstructive sleep apnea not severe enough forCPAP  PREDIABETES (R73.03) Impression: We discussed the findings of prediabetes. We discussed that this should improve with weight loss surgery.  VITAMIN D DEFICIENCY (E55.9) Impression: I encouraged her to continue with the once a week vitamin D supplementation  Leighton Ruff. [name], MD, FACS General, Bariatric, & Minimally Invasive Surgery [facility], [place]

## 2017-03-21 NOTE — Progress Notes (Signed)
01-31-17 (EPIC) EKG, CXR

## 2017-03-21 NOTE — Patient Instructions (Signed)
Cheryl Craig  03/21/2017   Your procedure is scheduled on: 04-01-17  Report to [facility] Main  Entrance Take [place] Elevators to 3rd floor to  [place] at 9:15 AM.   Call this number if you have problems the morning of surgery [phone]    Remember: ONLY 1 PERSON MAY GO WITH YOU TO SHORT STAY TO GET  READY MORNING OF [place].  Do not eat food or drink liquids :After Midnight.     Take these medicines the morning of surgery with A SIP OF WATER: None                                You may not have any metal on your body including hair pins and              piercings  Do not wear jewelry, make-up, lotions, powders or perfumes, deodorant             Do not wear nail polish.  Do not shave  48 hours prior to surgery.                Do not bring valuables to the hospital. [place].  Contacts, dentures or bridgework may not be worn into surgery.  Leave suitcase in the car. After surgery it may be brought to your room.                Please read over the following fact sheets you were given: _____________________________________________________________________             [facility] - Preparing for Surgery Before surgery, you can play an important role.  Because skin is not sterile, your skin needs to be as free of germs as possible.  You can reduce the number of germs on your skin by washing with CHG (chlorahexidine gluconate) soap before surgery.  CHG is an antiseptic cleaner which kills germs and bonds with the skin to continue killing germs even after washing. Please DO NOT use if you have an allergy to CHG or antibacterial soaps.  If your skin becomes reddened/irritated stop using the CHG and inform your nurse when you arrive at Short Stay. Do not shave (including legs and underarms) for at least 48 hours prior to the first CHG shower.  You may shave your face/neck. Please follow these  instructions carefully:  1.  Shower with CHG Soap the night before surgery and the  morning of Surgery.  2.  If you choose to wash your hair, wash your hair first as usual with your  normal  shampoo.  3.  After you shampoo, rinse your hair and body thoroughly to remove the  shampoo.                           4.  Use CHG as you would any other liquid soap.  You can apply chg directly  to the skin and wash                       Gently with a scrungie or clean washcloth.  5.  Apply the CHG Soap to your body ONLY FROM THE NECK  DOWN.   Do not use on face/ open                           Wound or open sores. Avoid contact with eyes, ears mouth and genitals (private parts).                       Wash face,  Genitals (private parts) with your normal soap.             6.  Wash thoroughly, paying special attention to the area where your surgery  will be performed.  7.  Thoroughly rinse your body with warm water from the neck down.  8.  DO NOT shower/wash with your normal soap after using and rinsing off  the CHG Soap.                9.  Pat yourself dry with a clean towel.            10.  Wear clean pajamas.            11.  Place clean sheets on your bed the night of your first shower and do not  sleep with pets. Day of Surgery : Do not apply any lotions/deodorants the morning of surgery.  Please wear clean clothes to the hospital/surgery center.  FAILURE TO FOLLOW THESE INSTRUCTIONS MAY RESULT IN THE CANCELLATION OF YOUR SURGERY PATIENT SIGNATURE_________________________________  NURSE SIGNATURE__________________________________  ________________________________________________________________________   Adam Phenix  An incentive spirometer is a tool that can help keep your lungs clear and active. This tool measures how well you are filling your lungs with each breath. Taking long deep breaths may help reverse or decrease the chance of developing breathing (pulmonary) problems (especially  infection) following:  A long period of time when you are unable to move or be active. BEFORE THE PROCEDURE   If the spirometer includes an indicator to show your best effort, your nurse or respiratory therapist will set it to a desired goal.  If possible, sit up straight or lean slightly forward. Try not to slouch.  Hold the incentive spirometer in an upright position. INSTRUCTIONS FOR USE  1. Sit on the edge of your bed if possible, or sit up as far as you can in bed or on a chair. 2. Hold the incentive spirometer in an upright position. 3. Breathe out normally. 4. Place the mouthpiece in your mouth and seal your lips tightly around it. 5. Breathe in slowly and as deeply as possible, raising the piston or the ball toward the top of the column. 6. Hold your breath for 3-5 seconds or for as long as possible. Allow the piston or ball to fall to the bottom of the column. 7. Remove the mouthpiece from your mouth and breathe out normally. 8. Rest for a few seconds and repeat Steps 1 through 7 at least 10 times every 1-2 hours when you are awake. Take your time and take a few normal breaths between deep breaths. 9. The spirometer may include an indicator to show your best effort. Use the indicator as a goal to work toward during each repetition. 10. After each set of 10 deep breaths, practice coughing to be sure your lungs are clear. If you have an incision (the cut made at the time of surgery), support your incision when coughing by placing a pillow or rolled up towels firmly against it. Once you are able to  get out of bed, walk around indoors and cough well. You may stop using the incentive spirometer when instructed by your caregiver.  RISKS AND COMPLICATIONS  Take your time so you do not get dizzy or light-headed.  If you are in pain, you may need to take or ask for pain medication before doing incentive spirometry. It is harder to take a deep breath if you are having pain. AFTER  USE  Rest and breathe slowly and easily.  It can be helpful to keep track of a log of your progress. Your caregiver can provide you with a simple table to help with this. If you are using the spirometer at home, follow these instructions: [place] IF:   You are having difficultly using the spirometer.  You have trouble using the spirometer as often as instructed.  Your pain medication is not giving enough relief while using the spirometer.  You develop fever of 100.5 F (38.1 C) or higher. SEEK IMMEDIATE MEDICAL CARE IF:   You cough up bloody sputum that had not been present before.  You develop fever of 102 F (38.9 C) or greater.  You develop worsening pain at or near the incision site. MAKE SURE YOU:   Understand these instructions.  Will watch your condition.  Will get help right away if you are not doing well or get worse. Document Released: 09/02/2006 Document Revised: 07/15/2011 Document Reviewed: 11/03/2006 [facility] Patient Information 2014 [place], [place].   ________________________________________________________________________

## 2017-03-24 ENCOUNTER — Encounter (HOSPITAL_COMMUNITY)
Admission: RE | Admit: 2017-03-24 | Discharge: 2017-03-24 | Disposition: A | Discharge status: Home / Self Care | Payer: 59 | Source: Ambulatory Visit | Attending: General Surgery | Admitting: General Surgery

## 2017-03-24 ENCOUNTER — Other Ambulatory Visit: Payer: Self-pay

## 2017-03-24 ENCOUNTER — Encounter (HOSPITAL_COMMUNITY): Payer: Self-pay

## 2017-03-24 ENCOUNTER — Telehealth: Payer: Self-pay | Admitting: Registered"

## 2017-03-24 DIAGNOSIS — Z01812 Encounter for preprocedural laboratory examination: Secondary | ICD-10-CM | POA: Insufficient documentation

## 2017-03-24 LAB — COMPREHENSIVE METABOLIC PANEL
ALBUMIN: 4.1 g/dL (ref 3.5–5.0)
ALK PHOS: 111 U/L (ref 38–126)
ALT: 20 U/L (ref 14–54)
AST: 23 U/L (ref 15–41)
Anion gap: 9 (ref 5–15)
BUN: 22 mg/dL — ABNORMAL HIGH (ref 6–20)
CALCIUM: 9.3 mg/dL (ref 8.9–10.3)
CO2: 27 mmol/L (ref 22–32)
CREATININE: 0.91 mg/dL (ref 0.44–1.00)
Chloride: 102 mmol/L (ref 101–111)
GFR calc Af Amer: >60 mL/min (ref >60)
GFR calc non Af Amer: >60 mL/min (ref >60)
GLUCOSE: 103 mg/dL — AB (ref 65–99)
Potassium: 4.8 mmol/L (ref 3.5–5.1)
SODIUM: 138 mmol/L (ref 135–145)
Total Bilirubin: 0.5 mg/dL (ref 0.3–1.2)
Total Protein: 8.2 g/dL — ABNORMAL HIGH (ref 6.5–8.1)

## 2017-03-24 LAB — CBC WITH DIFFERENTIAL/PLATELET
BASOS PCT: 0 %
Basophils Absolute: 0 10*3/uL (ref 0.0–0.1)
EOS ABS: 0.2 10*3/uL (ref 0.0–0.7)
Eosinophils Relative: 2 %
HCT: 38.2 % (ref 36.0–46.0)
HEMOGLOBIN: 12.5 g/dL (ref 12.0–15.0)
Lymphocytes Relative: 41 %
Lymphs Abs: 3.5 10*3/uL (ref 0.7–4.0)
MCH: 28.8 pg (ref 26.0–34.0)
MCHC: 32.7 g/dL (ref 30.0–36.0)
MCV: 88 fL (ref 78.0–100.0)
MONOS PCT: 6 %
Monocytes Absolute: 0.5 10*3/uL (ref 0.1–1.0)
NEUTROS PCT: 51 %
Neutro Abs: 4.2 10*3/uL (ref 1.7–7.7)
PLATELETS: 347 10*3/uL (ref 150–400)
RBC: 4.34 MIL/uL (ref 3.87–5.11)
RDW: 14 % (ref 11.5–15.5)
WBC: 8.3 10*3/uL (ref 4.0–10.5)

## 2017-03-24 MED FILL — PANTOPRAZOLE SOD DR 40 MG T: 40 | 90 days supply | Qty: 90 | Fill #0

## 2017-03-24 MED FILL — oxyCODONE HCL 5 MG/5ML SOLN: 5 | 2 days supply | Qty: 100 | Fill #0

## 2017-03-24 MED FILL — ONDANSETRON ODT 4 MG TABLET: 4 | 3 days supply | Qty: 15 | Fill #0

## 2017-03-24 NOTE — Telephone Encounter (Signed)
Pt called to verify what to eat for pre-op diet. RD returned phone call and left voicemail message.

## 2017-03-25 ENCOUNTER — Other Ambulatory Visit: Payer: Self-pay | Admitting: *Deleted

## 2017-03-25 NOTE — Patient Outreach (Signed)
[place] [facility]) Care Management  03/25/2017  Cheryl Craig Oct 03, 1965 [id number]   Subjective: Telephone call to patient's home  / mobile number, no answer, left HIPAA compliant voicemail message, and requested call back.    Objective: Per KPN (Knowledge Performance Now, point of care tool) and chart review, patient to be admitted on 04/01/17 for [place] at [facility].    Assessment: Received [facility]MR Transition of care referral on 03/21/17.  Transition of care follow up pending patient contact.     Plan: RNCM will call patient for 2nd telephone outreach attempt, preoperative call follow up, within 10 business days if no return call.    Ethan Clayburn H. Annia Friendly, BSN, [place] Management [facility] Telephonic CM Phone: [phone] Fax: [phone]

## 2017-03-31 ENCOUNTER — Other Ambulatory Visit: Payer: Self-pay | Admitting: *Deleted

## 2017-03-31 ENCOUNTER — Ambulatory Visit: Payer: Self-pay | Admitting: *Deleted

## 2017-03-31 NOTE — Patient Outreach (Signed)
[facility]) Care Management  03/31/2017  SHETARA LAUNER [birth year] [id number]   Subjective: Received voicemail message from Ms. Gaspar Bidding, states she is returning call, and requested call back. Telephone call to patient's home / mobile number, spoke with patient, and HIPAA verified.  Discussed [facility] Care Management UMR Transition of care follow up, preoperative call follow up, patient voiced understanding, and is in agreement to both types of follow up.  Patient states she is ready to get surgery over with, estimated length of stay 1 -2 days, and husband will be available to assist with activities of daily living / home management as needed. Patient voices understanding of medical diagnosis, pending surgery, and treatment plan.   States she is accessing the following [facility] benefits: outpatient pharmacy, hospital indemnity, short term disability,  and has been approved for family medical leave act (FMLA).  Advised of Advanced Directives completion resources through [place] care, patient voiced understanding, appreciative of information, and states she may access at a later time if needed. Patient states she does not have any preoperative questions, care coordination, disease management, disease monitoring, transportation, community resource, or pharmacy needs at this time. States she is very appreciative of the follow up and is in agreement to receive [facility] information post transition of care follow up.     Objective: Per KPN (Knowledge Performance Now, point of care tool) and chart review, patient to be admitted on 04/01/17 for [place] [facility].   Assessment: Received UMR Transition of care referral on 03/21/17.  Preoperative call completed, and transition of care follow up pending notification of patient discharge.    Plan: RNCM will call patient for  telephone outreach attempt,  transition of care follow up, within 3 business days of hospital discharge notification.     Germain Koopmann H. Annia Friendly, BSN, [place] Management [facility] Telephonic CM Phone: [phone] Fax: [phone]

## 2017-04-01 ENCOUNTER — Other Ambulatory Visit: Payer: Self-pay

## 2017-04-01 ENCOUNTER — Inpatient Hospital Stay (HOSPITAL_COMMUNITY)
Admission: RE | Admit: 2017-04-01 | Discharge: 2017-04-03 | DRG: 621 | Disposition: A | Discharge status: Home / Self Care | Payer: 59 | Source: Ambulatory Visit | Attending: General Surgery | Admitting: General Surgery

## 2017-04-01 ENCOUNTER — Inpatient Hospital Stay (HOSPITAL_COMMUNITY): Payer: 59 | Admitting: Anesthesiology

## 2017-04-01 ENCOUNTER — Encounter (HOSPITAL_COMMUNITY): Payer: Self-pay | Admitting: *Deleted

## 2017-04-01 ENCOUNTER — Encounter (HOSPITAL_COMMUNITY)
Admission: RE | Disposition: A | Discharge status: Home / Self Care | Payer: Self-pay | Source: Ambulatory Visit | Attending: General Surgery

## 2017-04-01 DIAGNOSIS — Z6841 Body Mass Index (BMI) 40.0 and over, adult: Secondary | ICD-10-CM

## 2017-04-01 DIAGNOSIS — M170 Bilateral primary osteoarthritis of knee: Secondary | ICD-10-CM | POA: Diagnosis present

## 2017-04-01 DIAGNOSIS — G8929 Other chronic pain: Secondary | ICD-10-CM | POA: Diagnosis present

## 2017-04-01 DIAGNOSIS — K449 Diaphragmatic hernia without obstruction or gangrene: Secondary | ICD-10-CM | POA: Diagnosis not present

## 2017-04-01 DIAGNOSIS — Z9884 Bariatric surgery status: Secondary | ICD-10-CM

## 2017-04-01 DIAGNOSIS — K2950 Unspecified chronic gastritis without bleeding: Secondary | ICD-10-CM | POA: Diagnosis not present

## 2017-04-01 DIAGNOSIS — M722 Plantar fascial fibromatosis: Secondary | ICD-10-CM | POA: Diagnosis present

## 2017-04-01 DIAGNOSIS — K210 Gastro-esophageal reflux disease with esophagitis: Secondary | ICD-10-CM | POA: Diagnosis present

## 2017-04-01 DIAGNOSIS — M545 Low back pain: Secondary | ICD-10-CM | POA: Diagnosis present

## 2017-04-01 DIAGNOSIS — E559 Vitamin D deficiency, unspecified: Secondary | ICD-10-CM | POA: Diagnosis present

## 2017-04-01 DIAGNOSIS — K219 Gastro-esophageal reflux disease without esophagitis: Secondary | ICD-10-CM | POA: Diagnosis present

## 2017-04-01 DIAGNOSIS — G4733 Obstructive sleep apnea (adult) (pediatric): Secondary | ICD-10-CM | POA: Diagnosis present

## 2017-04-01 DIAGNOSIS — R7303 Prediabetes: Secondary | ICD-10-CM | POA: Diagnosis present

## 2017-04-01 HISTORY — PX: LAPAROSCOPIC GASTRIC SLEEVE RESECTION: SHX5895

## 2017-04-01 LAB — HEMOGLOBIN AND HEMATOCRIT, BLOOD
HCT: 35.8 % — ABNORMAL LOW (ref 36.0–46.0)
Hemoglobin: 11.8 g/dL — ABNORMAL LOW (ref 12.0–15.0)

## 2017-04-01 LAB — PREGNANCY, URINE: Preg Test, Ur: NEGATIVE

## 2017-04-01 LAB — GLUCOSE, CAPILLARY: Glucose-Capillary: 122 mg/dL — ABNORMAL HIGH (ref 65–99)

## 2017-04-01 SURGERY — GASTRECTOMY, SLEEVE, LAPAROSCOPIC
Anesthesia: General | Site: Abdomen

## 2017-04-01 MED ORDER — DEXAMETHASONE SODIUM PHOSPHATE 10 MG/ML IJ SOLN
INTRAMUSCULAR | Status: DC | PRN
Start: 2017-04-01 — End: 2017-04-01
  Administered 2017-04-01: 10 mg via INTRAVENOUS

## 2017-04-01 MED ORDER — SODIUM CHLORIDE 0.9 % IJ SOLN
INTRAMUSCULAR | Status: AC
  Filled 2017-04-01: qty 10

## 2017-04-01 MED ORDER — HYDROMORPHONE HCL 1 MG/ML IJ SOLN
INTRAMUSCULAR | Status: AC
  Filled 2017-04-01: qty 1

## 2017-04-01 MED ORDER — STERILE WATER FOR IRRIGATION IR SOLN
Status: DC | PRN
  Administered 2017-04-01: 2000 mL

## 2017-04-01 MED ORDER — DEXAMETHASONE SODIUM PHOSPHATE 4 MG/ML IJ SOLN: 4.0000 mg | INTRAMUSCULAR | Status: DC

## 2017-04-01 MED ORDER — GABAPENTIN 300 MG PO CAPS
300.0000 mg | ORAL_CAPSULE | ORAL | Status: AC
  Administered 2017-04-01: 300 mg via ORAL
  Filled 2017-04-01: qty 1

## 2017-04-01 MED ORDER — ROCURONIUM BROMIDE 50 MG/5ML IV SOSY
PREFILLED_SYRINGE | INTRAVENOUS | Status: AC
  Filled 2017-04-01: qty 5

## 2017-04-01 MED ORDER — MIDAZOLAM HCL 2 MG/2ML IJ SOLN
INTRAMUSCULAR | Status: DC | PRN
Start: 2017-04-01 — End: 2017-04-01
  Administered 2017-04-01: 2 mg via INTRAVENOUS

## 2017-04-01 MED ORDER — ACETAMINOPHEN 500 MG PO TABS
1000.0000 mg | ORAL_TABLET | ORAL | Status: AC
  Administered 2017-04-01: 1000 mg via ORAL
  Filled 2017-04-01: qty 2

## 2017-04-01 MED ORDER — DIPHENHYDRAMINE HCL 50 MG/ML IJ SOLN
12.5000 mg | Freq: Three times a day (TID) | INTRAMUSCULAR | Status: DC | PRN
Start: 2017-04-01 — End: 2017-04-03

## 2017-04-01 MED ORDER — SUGAMMADEX SODIUM 200 MG/2ML IV SOLN
INTRAVENOUS | Status: DC | PRN
  Administered 2017-04-01: 300 mg via INTRAVENOUS

## 2017-04-01 MED ORDER — ONDANSETRON HCL 4 MG/2ML IJ SOLN
INTRAMUSCULAR | Status: AC
  Filled 2017-04-01: qty 2

## 2017-04-01 MED ORDER — CHLORHEXIDINE GLUCONATE 4 % EX LIQD: 60.0000 mL | Freq: Once | CUTANEOUS | Status: DC

## 2017-04-01 MED ORDER — PREMIER PROTEIN SHAKE
2.0000 [oz_av] | ORAL | Status: DC
  Administered 2017-04-03 (×3): 2 [oz_av] via ORAL

## 2017-04-01 MED ORDER — ONDANSETRON HCL 4 MG/2ML IJ SOLN
INTRAMUSCULAR | Status: DC | PRN
  Administered 2017-04-01: 4 mg via INTRAVENOUS

## 2017-04-01 MED ORDER — OXYCODONE HCL 5 MG PO TABS: 5.0000 mg | ORAL_TABLET | Freq: Once | ORAL | Status: DC | PRN

## 2017-04-01 MED ORDER — LIDOCAINE 2% (20 MG/ML) 5 ML SYRINGE
INTRAMUSCULAR | Status: DC | PRN
  Administered 2017-04-01: 1.5 mg/kg/h via INTRAVENOUS

## 2017-04-01 MED ORDER — PROPOFOL 10 MG/ML IV BOLUS
INTRAVENOUS | Status: AC
  Filled 2017-04-01: qty 20

## 2017-04-01 MED ORDER — OXYCODONE HCL 5 MG/5ML PO SOLN: 5.0000 mg | Freq: Once | ORAL | Status: DC | PRN

## 2017-04-01 MED ORDER — PHENYLEPHRINE 40 MCG/ML (10ML) SYRINGE FOR IV PUSH (FOR BLOOD PRESSURE SUPPORT)
PREFILLED_SYRINGE | INTRAVENOUS | Status: DC | PRN
  Administered 2017-04-01: 80 ug via INTRAVENOUS

## 2017-04-01 MED ORDER — PANTOPRAZOLE SODIUM 40 MG IV SOLR
40.0000 mg | Freq: Every day | INTRAVENOUS | Status: DC
  Administered 2017-04-01 – 2017-04-02 (×2): 40 mg via INTRAVENOUS
  Filled 2017-04-01 (×2): qty 40

## 2017-04-01 MED ORDER — PHENYLEPHRINE 40 MCG/ML (10ML) SYRINGE FOR IV PUSH (FOR BLOOD PRESSURE SUPPORT)
PREFILLED_SYRINGE | INTRAVENOUS | Status: AC
  Filled 2017-04-01: qty 10

## 2017-04-01 MED ORDER — OXYCODONE HCL 5 MG/5ML PO SOLN: 5.0000 mg | ORAL | Status: DC | PRN

## 2017-04-01 MED ORDER — ONDANSETRON HCL 4 MG/2ML IJ SOLN
4.0000 mg | Freq: Four times a day (QID) | INTRAMUSCULAR | Status: DC | PRN
  Administered 2017-04-02 (×3): 4 mg via INTRAVENOUS
  Filled 2017-04-01 (×3): qty 2

## 2017-04-01 MED ORDER — KCL IN DEXTROSE-NACL 20-5-0.45 MEQ/L-%-% IV SOLN
INTRAVENOUS | Status: DC
  Administered 2017-04-01: 125 mL/h via INTRAVENOUS
  Administered 2017-04-02 – 2017-04-03 (×3): via INTRAVENOUS
  Filled 2017-04-01 (×6): qty 1000

## 2017-04-01 MED ORDER — FENTANYL CITRATE (PF) 100 MCG/2ML IJ SOLN
INTRAMUSCULAR | Status: DC | PRN
  Administered 2017-04-01 (×4): 50 ug via INTRAVENOUS
  Administered 2017-04-01: 150 ug via INTRAVENOUS

## 2017-04-01 MED ORDER — DEXAMETHASONE SODIUM PHOSPHATE 10 MG/ML IJ SOLN
INTRAMUSCULAR | Status: AC
  Filled 2017-04-01: qty 1

## 2017-04-01 MED ORDER — LACTATED RINGERS IV SOLN
INTRAVENOUS | Status: DC
  Administered 2017-04-01 (×2): via INTRAVENOUS

## 2017-04-01 MED ORDER — FENTANYL CITRATE (PF) 100 MCG/2ML IJ SOLN
INTRAMUSCULAR | Status: AC
  Filled 2017-04-01: qty 2

## 2017-04-01 MED ORDER — APREPITANT 40 MG PO CAPS
40.0000 mg | ORAL_CAPSULE | ORAL | Status: AC
  Administered 2017-04-01: 40 mg via ORAL
  Filled 2017-04-01: qty 1

## 2017-04-01 MED ORDER — SODIUM CHLORIDE 0.9 % IJ SOLN
INTRAMUSCULAR | Status: AC
  Filled 2017-04-01: qty 50

## 2017-04-01 MED ORDER — ROCURONIUM BROMIDE 10 MG/ML (PF) SYRINGE
PREFILLED_SYRINGE | INTRAVENOUS | Status: DC | PRN
  Administered 2017-04-01: 50 mg via INTRAVENOUS
  Administered 2017-04-01 (×2): 10 mg via INTRAVENOUS

## 2017-04-01 MED ORDER — PROMETHAZINE HCL 25 MG/ML IJ SOLN: 6.2500 mg | INTRAMUSCULAR | Status: DC | PRN

## 2017-04-01 MED ORDER — SCOPOLAMINE 1 MG/3DAYS TD PT72
1.0000 | MEDICATED_PATCH | TRANSDERMAL | Status: DC
  Administered 2017-04-01: 1.5 mg via TRANSDERMAL
  Filled 2017-04-01: qty 1

## 2017-04-01 MED ORDER — BUPIVACAINE LIPOSOME 1.3 % IJ SUSP
20.0000 mL | Freq: Once | INTRAMUSCULAR | Status: AC
  Administered 2017-04-01: 20 mL
  Filled 2017-04-01: qty 20

## 2017-04-01 MED ORDER — LACTATED RINGERS IR SOLN
Status: DC | PRN
  Administered 2017-04-01: 1000 mL

## 2017-04-01 MED ORDER — SODIUM CHLORIDE 0.9 % IJ SOLN
INTRAMUSCULAR | Status: DC | PRN
  Administered 2017-04-01: 50 mL

## 2017-04-01 MED ORDER — PROMETHAZINE HCL 25 MG/ML IJ SOLN
12.5000 mg | Freq: Four times a day (QID) | INTRAMUSCULAR | Status: DC | PRN
  Administered 2017-04-02: 12.5 mg via INTRAVENOUS
  Filled 2017-04-01: qty 1

## 2017-04-01 MED ORDER — LIDOCAINE 2% (20 MG/ML) 5 ML SYRINGE
INTRAMUSCULAR | Status: AC
  Filled 2017-04-01: qty 5

## 2017-04-01 MED ORDER — PROPOFOL 10 MG/ML IV BOLUS
INTRAVENOUS | Status: DC | PRN
  Administered 2017-04-01: 150 mg via INTRAVENOUS

## 2017-04-01 MED ORDER — HEPARIN SODIUM (PORCINE) 5000 UNIT/ML IJ SOLN
5000.0000 [IU] | INTRAMUSCULAR | Status: AC
  Administered 2017-04-01: 5000 [IU] via SUBCUTANEOUS
  Filled 2017-04-01: qty 1

## 2017-04-01 MED ORDER — ACETAMINOPHEN 160 MG/5ML PO SOLN
650.0000 mg | Freq: Four times a day (QID) | ORAL | Status: DC
  Administered 2017-04-01 – 2017-04-03 (×7): 650 mg via ORAL
  Filled 2017-04-01 (×7): qty 20.3

## 2017-04-01 MED ORDER — LIDOCAINE 2% (20 MG/ML) 5 ML SYRINGE
INTRAMUSCULAR | Status: AC
  Filled 2017-04-01: qty 10

## 2017-04-01 MED ORDER — MEPERIDINE HCL 50 MG/ML IJ SOLN: 6.2500 mg | INTRAMUSCULAR | Status: DC | PRN

## 2017-04-01 MED ORDER — LIDOCAINE 2% (20 MG/ML) 5 ML SYRINGE
INTRAMUSCULAR | Status: DC | PRN
Start: 2017-04-01 — End: 2017-04-01
  Administered 2017-04-01: 100 mg via INTRAVENOUS

## 2017-04-01 MED ORDER — ENOXAPARIN SODIUM 30 MG/0.3ML ~~LOC~~ SOLN
30.0000 mg | Freq: Two times a day (BID) | SUBCUTANEOUS | Status: DC
  Administered 2017-04-01 – 2017-04-03 (×4): 30 mg via SUBCUTANEOUS
  Filled 2017-04-01 (×4): qty 0.3

## 2017-04-01 MED ORDER — CEFOTETAN DISODIUM-DEXTROSE 2-2.08 GM-%(50ML) IV SOLR
2.0000 g | INTRAVENOUS | Status: AC
  Administered 2017-04-01: 2 g via INTRAVENOUS
  Filled 2017-04-01: qty 50

## 2017-04-01 MED ORDER — MORPHINE SULFATE (PF) 2 MG/ML IV SOLN
1.0000 mg | INTRAVENOUS | Status: DC | PRN
  Administered 2017-04-01 – 2017-04-02 (×4): 2 mg via INTRAVENOUS
  Filled 2017-04-01 (×4): qty 1

## 2017-04-01 MED ORDER — 0.9 % SODIUM CHLORIDE (POUR BTL) OPTIME
TOPICAL | Status: DC | PRN
  Administered 2017-04-01: 1000 mL

## 2017-04-01 MED ORDER — HYDROMORPHONE HCL 1 MG/ML IJ SOLN
0.2500 mg | INTRAMUSCULAR | Status: DC | PRN
  Administered 2017-04-01 (×3): 0.5 mg via INTRAVENOUS

## 2017-04-01 MED ORDER — SUCCINYLCHOLINE CHLORIDE 200 MG/10ML IV SOSY
PREFILLED_SYRINGE | INTRAVENOUS | Status: AC
  Filled 2017-04-01: qty 10

## 2017-04-01 MED ORDER — GABAPENTIN 250 MG/5ML PO SOLN
200.0000 mg | Freq: Two times a day (BID) | ORAL | Status: DC
  Administered 2017-04-01 – 2017-04-03 (×4): 200 mg via ORAL
  Filled 2017-04-01 (×5): qty 4

## 2017-04-01 MED ORDER — FENTANYL CITRATE (PF) 250 MCG/5ML IJ SOLN
INTRAMUSCULAR | Status: AC
  Filled 2017-04-01: qty 5

## 2017-04-01 MED ORDER — SUGAMMADEX SODIUM 500 MG/5ML IV SOLN
INTRAVENOUS | Status: AC
  Filled 2017-04-01: qty 5

## 2017-04-01 MED ORDER — SIMETHICONE 80 MG PO CHEW
80.0000 mg | CHEWABLE_TABLET | Freq: Four times a day (QID) | ORAL | Status: DC | PRN
  Administered 2017-04-02 (×2): 80 mg via ORAL
  Filled 2017-04-01 (×2): qty 1

## 2017-04-01 MED ORDER — MIDAZOLAM HCL 2 MG/2ML IJ SOLN
INTRAMUSCULAR | Status: AC
  Filled 2017-04-01: qty 2

## 2017-04-01 SURGICAL SUPPLY — 66 items
APPLICATOR COTTON TIP 6IN STRL (MISCELLANEOUS) IMPLANT
APPLIER CLIP ROT 10 11.4 M/L (STAPLE)
APPLIER CLIP ROT 13.4 12 LRG (CLIP)
BANDAGE ADH SHEER 1  50/CT (GAUZE/BANDAGES/DRESSINGS) ×3 IMPLANT
BENZOIN TINCTURE PRP APPL 2/3 (GAUZE/BANDAGES/DRESSINGS) ×3 IMPLANT
BLADE SURG SZ11 CARB STEEL (BLADE) ×3 IMPLANT
CABLE HIGH FREQUENCY MONO STRZ (ELECTRODE) ×3 IMPLANT
CHLORAPREP W/TINT 26ML (MISCELLANEOUS) ×6 IMPLANT
CLIP APPLIE ROT 10 11.4 M/L (STAPLE) IMPLANT
CLIP APPLIE ROT 13.4 12 LRG (CLIP) IMPLANT
CLOSURE WOUND 1/2 X4 (GAUZE/BANDAGES/DRESSINGS) ×1
DECANTER SPIKE VIAL GLASS SM (MISCELLANEOUS) IMPLANT
DEVICE SUT QUICK LOAD TK 5 (STAPLE) IMPLANT
DEVICE SUT TI-KNOT TK 5X26 (MISCELLANEOUS) IMPLANT
DEVICE SUTURE ENDOST 10MM (ENDOMECHANICALS) ×3 IMPLANT
DEVICE TI KNOT TK5 (MISCELLANEOUS)
DRAPE UTILITY XL STRL (DRAPES) ×6 IMPLANT
ELECT L-HOOK LAP 45CM DISP (ELECTROSURGICAL)
ELECT PENCIL ROCKER SW 15FT (MISCELLANEOUS) IMPLANT
ELECT REM PT RETURN 15FT ADLT (MISCELLANEOUS) ×3 IMPLANT
ELECTRODE L-HOOK LAP 45CM DISP (ELECTROSURGICAL) IMPLANT
GAUZE SPONGE 2X2 8PLY STRL LF (GAUZE/BANDAGES/DRESSINGS) IMPLANT
GAUZE SPONGE 4X4 12PLY STRL (GAUZE/BANDAGES/DRESSINGS) IMPLANT
GLOVE BIO SURGEON STRL SZ7.5 (GLOVE) ×3 IMPLANT
GLOVE INDICATOR 8.0 STRL GRN (GLOVE) ×3 IMPLANT
GOWN STRL REUS W/TWL XL LVL3 (GOWN DISPOSABLE) ×9 IMPLANT
GRASPER SUT TROCAR 14GX15 (MISCELLANEOUS) ×3 IMPLANT
HOVERMATT SINGLE USE (MISCELLANEOUS) ×3 IMPLANT
KIT BASIN OR (CUSTOM PROCEDURE TRAY) ×3 IMPLANT
MARKER SKIN DUAL TIP RULER LAB (MISCELLANEOUS) ×3 IMPLANT
NEEDLE SPNL 22GX3.5 QUINCKE BK (NEEDLE) ×3 IMPLANT
PACK UNIVERSAL I (CUSTOM PROCEDURE TRAY) ×3 IMPLANT
QUICK LOAD TK 5 (STAPLE)
RELOAD STAPLER BLUE 60MM (STAPLE) ×1 IMPLANT
RELOAD STAPLER GOLD 60MM (STAPLE) ×1 IMPLANT
RELOAD STAPLER GREEN 60MM (STAPLE) ×2 IMPLANT
SCISSORS LAP 5X45 EPIX DISP (ENDOMECHANICALS) ×3 IMPLANT
SEALANT SURGICAL APPL DUAL CAN (MISCELLANEOUS) IMPLANT
SET IRRIG TUBING LAPAROSCOPIC (IRRIGATION / IRRIGATOR) ×3 IMPLANT
SHEARS HARMONIC ACE PLUS 45CM (MISCELLANEOUS) ×3 IMPLANT
SLEEVE GASTRECTOMY 40FR VISIGI (MISCELLANEOUS) ×3 IMPLANT
SLEEVE XCEL OPT CAN 5 100 (ENDOMECHANICALS) ×9 IMPLANT
SOLUTION ANTI FOG 6CC (MISCELLANEOUS) ×3 IMPLANT
SPONGE GAUZE 2X2 STER 10/PKG (GAUZE/BANDAGES/DRESSINGS)
SPONGE LAP 18X18 X RAY DECT (DISPOSABLE) ×3 IMPLANT
STAPLER ECHELON BIOABSB 60 FLE (MISCELLANEOUS) ×24 IMPLANT
STAPLER ECHELON LONG 60 440 (INSTRUMENTS) ×3 IMPLANT
STAPLER RELOAD BLUE 60MM (STAPLE) ×3
STAPLER RELOAD GOLD 60MM (STAPLE) ×3
STAPLER RELOAD GREEN 60MM (STAPLE) ×6
STRIP CLOSURE SKIN 1/2X4 (GAUZE/BANDAGES/DRESSINGS) ×2 IMPLANT
SUT MNCRL AB 4-0 PS2 18 (SUTURE) ×3 IMPLANT
SUT SURGIDAC NAB ES-9 0 48 120 (SUTURE) ×3 IMPLANT
SUT VICRYL 0 TIES 12 18 (SUTURE) ×3 IMPLANT
SYR 20CC LL (SYRINGE) ×3 IMPLANT
SYR 50ML LL SCALE MARK (SYRINGE) ×3 IMPLANT
TOWEL OR 17X26 10 PK STRL BLUE (TOWEL DISPOSABLE) ×3 IMPLANT
TOWEL OR NON WOVEN STRL DISP B (DISPOSABLE) ×3 IMPLANT
TRAY FOLEY W/METER SILVER 16FR (SET/KITS/TRAYS/PACK) IMPLANT
TROCAR BLADELESS 15MM (ENDOMECHANICALS) ×3 IMPLANT
TROCAR BLADELESS OPT 5 100 (ENDOMECHANICALS) ×3 IMPLANT
TUBE CALIBRATION LAPBAND (TUBING) ×3 IMPLANT
TUBING CONNECTING 10 (TUBING) ×4 IMPLANT
TUBING CONNECTING 10' (TUBING) ×2
TUBING ENDO SMARTCAP (MISCELLANEOUS) ×3 IMPLANT
TUBING INSUF HEATED (TUBING) ×3 IMPLANT

## 2017-04-01 NOTE — Anesthesia Postprocedure Evaluation (Signed)
Anesthesia Post Note  Patient: Cheryl Craig  Procedure(s) Performed: LAPAROSCOPIC GASTRIC SLEEVE RESECTION REPAIR OF HIATAL HERNIA  WITH UPPER ENDOSCOPY (N/A Abdomen)     Patient location during evaluation: PACU Anesthesia Type: General Level of consciousness: awake and alert Pain management: pain level controlled Vital Signs Assessment: post-procedure vital signs reviewed and stable Respiratory status: spontaneous breathing, nonlabored ventilation and respiratory function stable Cardiovascular status: blood pressure returned to baseline and stable Postop Assessment: no apparent nausea or vomiting Anesthetic complications: no    Last Vitals:  Vitals:   04/01/17 1250 04/01/17 1330  BP: 131/67 131/74  Pulse: 72   Resp: 20   Temp: (!) 36.4 C   SpO2: 100%     Last Pain:  Vitals:   04/01/17 1415  TempSrc:   PainSc: Asleep                 Lynda Rainwater

## 2017-04-01 NOTE — Anesthesia Preprocedure Evaluation (Signed)
Anesthesia Evaluation  Patient identified by MRN, date of birth, ID band Patient awake    Reviewed: Allergy & Precautions, NPO status , Patient's Chart, lab work & pertinent test results  Airway Mallampati: II  TM Distance: >3 FB Neck ROM: Full    Dental no notable dental hx.    Pulmonary neg pulmonary ROS,    Pulmonary exam normal breath sounds clear to auscultation       Cardiovascular negative cardio ROS Normal cardiovascular exam Rhythm:Regular Rate:Normal     Neuro/Psych negative neurological ROS  negative psych ROS   GI/Hepatic negative GI ROS, Neg liver ROS, GERD  ,  Endo/Other  negative endocrine ROSMorbid obesity  Renal/GU negative Renal ROS  negative genitourinary   Musculoskeletal negative musculoskeletal ROS (+)   Abdominal   Peds negative pediatric ROS (+)  Hematology negative hematology ROS (+)   Anesthesia Other Findings   Reproductive/Obstetrics negative OB ROS                             Anesthesia Physical Anesthesia Plan  ASA: III  Anesthesia Plan: General   Post-op Pain Management:    Induction: Intravenous  PONV Risk Score and Plan: 3 and Ondansetron, Dexamethasone and Midazolam  Airway Management Planned: Oral ETT  Additional Equipment:   Intra-op Plan:   Post-operative Plan: Extubation in OR  Informed Consent: I have reviewed the patients History and Physical, chart, labs and discussed the procedure including the risks, benefits and alternatives for the proposed anesthesia with the patient or authorized representative who has indicated his/her understanding and acceptance.   Dental advisory given  Plan Discussed with: CRNA  Anesthesia Plan Comments:         Anesthesia Quick Evaluation

## 2017-04-01 NOTE — Op Note (Signed)
04/01/2017 Cheryl Craig [birth year] [id number]   PRE-OPERATIVE DIAGNOSIS:     Chronic midline low back pain   GERD (gastroesophageal reflux disease)   Prediabetes   Obstructive sleep apnea syndrome, mild   Plantar fasciitis   Morbid obesity (BMI 41)   POST-OPERATIVE DIAGNOSIS:  same  PROCEDURE:  Procedure(s): LAPAROSCOPIC SLEEVE GASTRECTOMY WITH HIATAL HERNIA REPAIR UPPER GI ENDOSCOPY  SURGEON:  Surgeon(s): [name], MD FACS FASMBS  ASSISTANTS: Alphonsa Overall MD FACS  ANESTHESIA:   general  DRAINS: none   BOUGIE: 40 fr ViSiGi  LOCAL MEDICATIONS USED:  Exparel  SPECIMEN:  Source of Specimen:  Greater curvature of stomach  DISPOSITION OF SPECIMEN:  PATHOLOGY  COUNTS:  YES  INDICATION FOR PROCEDURE: This is a very pleasant 51 y.o.-year-old morbidly obese female who has had unsuccessful attempts for sustained weight loss. The patient presents today for a planned laparoscopic sleeve gastrectomy with upper endoscopy. We have discussed the risk and benefits of the procedure extensively preoperatively. Please see my separate notes.  PROCEDURE: After obtaining informed consent and receiving 5000 units of subcutaneous heparin, the patient was brought to the operating room at [facility] and placed supine on the operating room table. General endotracheal anesthesia was established. Sequential compression devices were placed. A orogastric tube was placed. The patient's abdomen was prepped and draped in the usual standard surgical fashion. The patient received preoperative IV antibiotic. A surgical timeout was performed.  Access to the abdomen was achieved using a 5 mm 0 laparoscope thru a 5 mm trocar In the left upper Quadrant 2 fingerbreadths below the left subcostal margin using the Optiview technique. Pneumoperitoneum was smoothly established up to 15 mm of mercury. The laparoscope was advanced and the abdominal cavity was surveilled. The patient was then placed in  reverse Trendelenburg. There was evidence of a hiatal hernia on laparoscopy - gap in the left and right crus anteriorly.  A 5 mm trocar was placed slightly above and to the left of the umbilicus under direct visualization.  The [facility] liver retractor was placed under the left lobe of the liver through a 5 mm trocar incision site in the subxiphoid position. A 5 mm trocar was placed in the lateral right upper quadrant along with a 15 mm trocar in the mid right abdomen. A final 5 mm trocar was placed in the lateral LUQ.  All under direct visualization after exparel had been infiltrated in bilateral lateral upper abdominal wall as a TAP block  The stomach was inspected. It was completely decompressed and the orogastric tube was removed.  There is a small anterior dimple that was obviously visible. The calibration tube was placed in the oropharynx and guided down into the stomach by the CRNA. 10 mL of air was insufflated into the calibration balloon. The calibration tubing was then gently pulled back by the CRNA and it slid past the GE junction. At this point the calibration tubing was desufflated and pulled back into the esophagus. This confirmed my suspicion of a clinically significant hiatal hernia. The gastrohepatic ligament was incised with harmonic scalpel. The right crus was identified. We identified the crossing fat along the right crus. The adipose tissue just above this area was incised with harmonic scalpel. I then bluntly dissected out this area and identified the left crus. There was evidence of a hiatal hernia. I then mobilized the esophagus. The left and right crus were further mobilized with blunt dissection. I was then able to reapproximate the left and right  crus with 0 Ethibond using an Endostitch suture device and securing it with a titanium tyknot.  We then had the CRNA readvanced the calibration tubing back into the stomach. 10 mL of air was insufflated into the calibration tube balloon. The  calibration tube was then gently pulled back and there was resistance at the GE junction. The tube did not slide back up into the esophagus. At this point the calibration tubing was deflated and removed from the patient's body.   We identified the pylorus and measured 6 cm proximal to the pylorus and identified an area of where we would start taking down the short gastric vessels. Harmonic scalpel was used to take down the short gastric vessels along the greater curvature of the stomach. We were able to enter the lesser sac. We continued to march along the greater curvature of the stomach taking down the short gastrics. As we approached the gastrosplenic ligament we took care in this area not to injure the spleen. We were able to take down the entire gastrosplenic ligament. We then mobilized the fundus away from the left crus of diaphragm. There were not any significant posterior gastric avascular attachments. This left the stomach completely mobilized. No vessels had been taken down along the lesser curvature of the stomach.  We then reidentified the pylorus. A 40Fr ViSiGi was then placed in the oropharynx and advanced down into the stomach and placed in the distal antrum and positioned along the lesser curvature. It was placed under suction which secured the 40Fr ViSiGi in place along the lesser curve. Then using the Ethicon echelon 60 mm stapler with a green load with Seamguard, I placed a stapler along the antrum approximately 5 cm from the pylorus. The stapler was angled so that there is ample room at the angularis incisura. I then fired the first staple load after inspecting it posteriorly to ensure adequate space both anteriorly and posteriorly. At this point I still was not completely past the angularis so with another green load with Seamguard, I placed the stapler in position just inside the prior stapleline. We then rotated the stomach to insure that there was adequate anteriorly as well as  posteriorly. The stapler was then fired. I used another 49mm green cartridge with seamguard. At this point I started using 60 mm gold load staple cartridge x 1 with Seamguard. The echelon stapler was then repositioned with a 60 mm blue load with Seamguard and we continued to march up along the [place]. My assistant was holding traction along the greater curvature stomach along the cauterized short gastric vessels ensuring that the stomach was symmetrically retracted. Prior to each firing of the staple, we rotated the stomach to ensure that there is adequate stomach left.  As we approached the fundus, I used 60 mm blue cartridge with Seamguard aiming slightly lateral to the GE junction.  The sleeve was inspected. There is no evidence of cork screw. The staple line appeared hemostatic. The CRNA inflated the ViSiGi to the green zone and the upper abdomen was flooded with saline. There were no bubbles. The sleeve was decompressed and the ViSiGi removed. My assistant scrubbed out and performed an upper endoscopy. The sleeve easily distended with air and the scope was easily advanced to the pylorus. There is no evidence of internal bleeding or cork screwing. There was no narrowing at the angularis. There is no evidence of bubbles. Please see his operative note for further details. The gastric sleeve was decompressed and the endoscope was  removed.  The greater curvature the stomach was grasped with a laparoscopic grasper and removed from the 15 mm trocar site.  The liver retractor was removed. I then closed the 15 mm trocar site with 1 interrupted 0 Vicryl sutures through the fascia using the endoclose. The closure was viewed laparoscopically and it was airtight.  Remaining Exparel was then infiltrated in the preperitoneal spaces around the trocar sites. Pneumoperitoneum was released. All trocar sites were closed with a 4-0 Monocryl in a subcuticular fashion followed by the application of benzoin, steri-strips, bandaids The  patient was extubated and taken to the recovery room in stable condition. All needle, instrument, and sponge counts were correct x2. There are no immediate complications  (2) 60 mm green with Seamguard (1) 60 mm gold with seamguard (2) 60 mm blue with  seamguard  PLAN OF CARE: Admit to inpatient   PATIENT DISPOSITION:  PACU - hemodynamically stable.   Delay start of Pharmacological VTE agent (>24hrs) due to surgical blood loss or risk of bleeding:  no  Cheryl Ruff. [name], MD, FACS FASMBS General, Bariatric, & Minimally Invasive Surgery [facility], [place]

## 2017-04-01 NOTE — Progress Notes (Signed)
Discussed post op day goals with patient including ambulation, IS, diet progression, pain, and nausea control.  Questions answered. 

## 2017-04-01 NOTE — Op Note (Signed)
Name:  Cheryl Craig MRN: [id number] Date of Surgery: 04/01/2017  Preop Diagnosis:  Morbid Obesity  Postop Diagnosis:  Morbid Obesity (Weight - 260, BMI - 43.4), S/P Gastric Sleeve resection  Procedure:  Upper endoscopy  (Intraoperative)  Surgeon:  Alphonsa Overall, M.D.  Anesthesia:  GET  Indications for procedure: [name] is a 51 y.o. female whose primary care physician is Kelton Pillar, MD and has completed a gastric sleeve resection today for weight loss by Dr. [name].  I am doing an intraoperative upper endoscopy to evaluate the gastric pouch after the sleeve gastrectomy.  Operative Note: The patient is under general anesthesia.  Dr. [name] is laparoscoping the patient while I do an upper endoscopy to evaluate the stomach pouch.  With the patient intubated, I passed the Pentax upper endoscope without difficulty down the esophagus.  The esophagus was unremarkable.  The esophago-gastric junction was at 37 cm.    The mucosa of the stomach looked viable and the staple line was intact without bleeding.  I advanced the scope to the pylorus, but did not go through it.  While I insufflated the stomach pouch with air, Dr. [name]  flooded the upper abdomen with saline to put the gastric pouch under saline.  There was no bubbling or evidence of a leak.  There was no evidence of narrowing of the pouch and the gastric sleeve looked tubular.  The scope was then withdrawn.  The esophagus was unremarkable and the patient tolerated the endoscopy without difficulty.  Alphonsa Overall, MD, [facility] Pager: [phone] Office phone:  [phone]

## 2017-04-01 NOTE — Anesthesia Procedure Notes (Signed)
Procedure Name: Intubation Date/Time: 04/01/2017 10:56 AM Performed by: Sharlette Dense, CRNA Patient Re-evaluated:Patient Re-evaluated prior to induction Oxygen Delivery Method: Circle system utilized Preoxygenation: Pre-oxygenation with 100% oxygen Induction Type: IV induction Ventilation: Mask ventilation without difficulty and Oral airway inserted - appropriate to patient size Laryngoscope Size: Sabra Heck and 2 Grade View: Grade I Tube type: Oral Tube size: 7.5 mm Number of attempts: 1 Airway Equipment and Method: Stylet Placement Confirmation: ETT inserted through vocal cords under direct vision,  positive ETCO2 and breath sounds checked- equal and bilateral Secured at: 21 cm Tube secured with: Tape Dental Injury: Teeth and Oropharynx as per pre-operative assessment

## 2017-04-01 NOTE — Transfer of Care (Signed)
Immediate Anesthesia Transfer of Care Note  Patient: Cheryl Craig  Procedure(s) Performed: LAPAROSCOPIC GASTRIC SLEEVE RESECTION REPAIR OF HIATAL HERNIA  WITH UPPER ENDOSCOPY (N/A Abdomen)  Patient Location: PACU  Anesthesia Type:General  Level of Consciousness: awake, alert  and oriented  Airway & Oxygen Therapy: Patient Spontanous Breathing and Patient connected to face mask oxygen  Post-op Assessment: Report given to RN and Post -op Vital signs reviewed and stable  Post vital signs: Reviewed and stable  Last Vitals:  Vitals:   04/01/17 0900  BP: (!) 141/86  Pulse: 93  Resp: 18  Temp: 36.9 C  SpO2: 100%    Last Pain:  Vitals:   04/01/17 0900  TempSrc: Oral      Patients Stated Pain Goal: 4 (11/03/14 0109)  Complications: No apparent anesthesia complications

## 2017-04-01 NOTE — Interval H&P Note (Signed)
History and Physical Interval Note:  04/01/2017 10:37 AM  Cheryl Craig  has presented today for surgery, with the diagnosis of MORBID OBESITY  The various methods of treatment have been discussed with the patient and family. After consideration of risks, benefits and other options for treatment, the patient has consented to  Procedure(s): LAPAROSCOPIC GASTRIC SLEEVE RESECTION WITH UPPER ENDO (N/A) as a surgical intervention .  The patient's history has been reviewed, patient examined, no change in status, stable for surgery.  I have reviewed the patient's chart and labs.  Questions were answered to the patient's satisfaction.     Greer Pickerel

## 2017-04-02 ENCOUNTER — Encounter (HOSPITAL_COMMUNITY): Payer: Self-pay | Admitting: General Surgery

## 2017-04-02 LAB — CBC WITH DIFFERENTIAL/PLATELET
Basophils Absolute: 0 10*3/uL (ref 0.0–0.1)
Basophils Relative: 0 %
EOS PCT: 0 %
Eosinophils Absolute: 0 10*3/uL (ref 0.0–0.7)
HCT: 35.6 % — ABNORMAL LOW (ref 36.0–46.0)
Hemoglobin: 11.5 g/dL — ABNORMAL LOW (ref 12.0–15.0)
LYMPHS ABS: 1.9 10*3/uL (ref 0.7–4.0)
LYMPHS PCT: 19 %
MCH: 28.5 pg (ref 26.0–34.0)
MCHC: 32.3 g/dL (ref 30.0–36.0)
MCV: 88.1 fL (ref 78.0–100.0)
MONO ABS: 0.6 10*3/uL (ref 0.1–1.0)
Monocytes Relative: 6 %
Neutro Abs: 7.7 10*3/uL (ref 1.7–7.7)
Neutrophils Relative %: 75 %
PLATELETS: 321 10*3/uL (ref 150–400)
RBC: 4.04 MIL/uL (ref 3.87–5.11)
RDW: 14.3 % (ref 11.5–15.5)
WBC: 10.2 10*3/uL (ref 4.0–10.5)

## 2017-04-02 LAB — COMPREHENSIVE METABOLIC PANEL
ALT: 26 U/L (ref 14–54)
AST: 27 U/L (ref 15–41)
Albumin: 3.5 g/dL (ref 3.5–5.0)
Alkaline Phosphatase: 84 U/L (ref 38–126)
Anion gap: 3 — ABNORMAL LOW (ref 5–15)
BUN: 9 mg/dL (ref 6–20)
CALCIUM: 8.7 mg/dL — AB (ref 8.9–10.3)
CHLORIDE: 107 mmol/L (ref 101–111)
CO2: 28 mmol/L (ref 22–32)
CREATININE: 0.73 mg/dL (ref 0.44–1.00)
Glucose, Bld: 141 mg/dL — ABNORMAL HIGH (ref 65–99)
Potassium: 4.3 mmol/L (ref 3.5–5.1)
Sodium: 138 mmol/L (ref 135–145)
TOTAL PROTEIN: 7.4 g/dL (ref 6.5–8.1)
Total Bilirubin: 0.6 mg/dL (ref 0.3–1.2)

## 2017-04-02 LAB — GLUCOSE, CAPILLARY: GLUCOSE-CAPILLARY: 90 mg/dL (ref 65–99)

## 2017-04-02 MED ORDER — CALCIUM CARBONATE-VITAMIN D 500-200 MG-UNIT PO TABS: 1.0000 | ORAL_TABLET | Freq: Three times a day (TID) | ORAL | 1 refills | Status: AC

## 2017-04-02 MED ORDER — OXYCODONE HCL 5 MG/5ML PO SOLN: 5.0000 mg | ORAL | 0 refills | Status: DC | PRN

## 2017-04-02 MED ORDER — MULTIVITAMIN ADULT PO TABS: 1.0000 | ORAL_TABLET | Freq: Two times a day (BID) | ORAL | 3 refills | Status: AC

## 2017-04-02 NOTE — Discharge Summary (Signed)
Physician Discharge Summary  Cheryl Craig [id number] DOB: 1966-01-19 DOA: 04/01/2017  PCP: Kelton Pillar, MD  Admit date: 04/01/2017 Discharge date: 04/02/2017  Recommendations for Outpatient Follow-up:  1.   Follow-up Information    Greer Pickerel, MD. Go on 04/16/2017.   Specialty:  General Surgery Why:  at [facility] information: [address] [place] [address] [phone]        Greer Pickerel, MD Follow up.   Specialty:  General Surgery Contact information: [place]  [place] [address] [phone]          Discharge Diagnoses:  Active Problems:   Chronic midline low back pain   GERD (gastroesophageal reflux disease)   Prediabetes   Obstructive sleep apnea syndrome, mild   Plantar fasciitis   Morbid obesity ([place])   S/P laparoscopic sleeve gastrectomy with hiatal hernia repari   Surgical Procedure: Laparoscopic Sleeve Gastrectomy with hiatal hernia repair;  upper endoscopy  Discharge Condition: Good Disposition: Home  Diet recommendation: Postoperative sleeve gastrectomy diet (liquids only)  Filed Weights   04/01/17 0900 04/01/17 0924  Weight: 113.2 kg (249 lb 8 oz) 113.4 kg (250 lb)     Hospital Course:  The patient was admitted for a planned laparoscopic sleeve gastrectomy. Please see operative note. Preoperatively the patient was given 5000 units of subcutaneous heparin for DVT prophylaxis. Postoperative prophylactic Lovenox dosing was started on the evening of postoperative day 0. ERAS protocol was used. On the evening of postoperative day 0, the patient was started on water and ice chips. On postoperative day 1 the patient had no fever or tachycardia and was tolerating water in their diet was gradually advanced throughout the day. The patient was ambulating without difficulty. Their vital signs are stable without fever or tachycardia. Their hemoglobin had remained stable.  The patient had received discharge  instructions and counseling. They were deemed stable for discharge and had met discharge criteria  BP (!) 123/41 (BP Location: Left Arm)   Pulse 66   Temp 98.1 F (36.7 C) (Oral)   Resp 18   Ht '5\' 5"'  (1.651 m)   Wt 113.4 kg (250 lb)   SpO2 95%   BMI 41.60 kg/m   Gen: alert, NAD, non-toxic appearing Pupils: equal, no scleral icterus Pulm: Lungs clear to auscultation, symmetric chest rise CV: regular rate and rhythm Abd: soft, min tender, nondistended. No cellulitis. No incisional hernia Ext: no edema, no calf tenderness Skin: no rash, no jaundice   Discharge Instructions  Discharge Instructions    Ambulate hourly while awake   Complete by:  As directed    Call MD for:  difficulty breathing, headache or visual disturbances   Complete by:  As directed    Call MD for:  persistant dizziness or light-headedness   Complete by:  As directed    Call MD for:  persistant nausea and vomiting   Complete by:  As directed    Call MD for:  redness, tenderness, or signs of infection (pain, swelling, redness, odor or green/yellow discharge around incision site)   Complete by:  As directed    Call MD for:  severe uncontrolled pain   Complete by:  As directed    Call MD for:  temperature >101 F   Complete by:  As directed    Diet bariatric full liquid   Complete by:  As directed    Discharge instructions   Complete by:  As directed    See bariatric discharge  instructions   Incentive spirometry   Complete by:  As directed    Perform hourly while awake     Allergies as of 04/02/2017   No Known Allergies     Medication List    STOP taking these medications   ibuprofen 200 MG tablet Commonly known as:  ADVIL,MOTRIN     TAKE these medications   acetaminophen 325 MG tablet Commonly known as:  TYLENOL Take 650 mg by mouth every 6 (six) hours as needed for moderate pain or fever.   calcium-vitamin D 500-200 MG-UNIT tablet Commonly known as:  OSCAL WITH D Take 1 tablet by mouth  3 (three) times daily.   MULTIVITAMIN ADULT Tabs Take 1 tablet by mouth 2 (two) times daily.   oxyCODONE 5 MG/5ML solution Commonly known as:  ROXICODONE Take 5-10 mLs (5-10 mg total) by mouth every 4 (four) hours as needed for moderate pain or severe pain.   Vitamin D (Ergocalciferol) 50000 units Caps capsule Commonly known as:  DRISDOL Take 1 capsule (50,000 Units total) by mouth every 7 (seven) days.      Follow-up Information    Greer Pickerel, MD. Go on 04/16/2017.   Specialty:  General Surgery Why:  at [facility] information: [address] [place] [address] [phone]        Greer Pickerel, MD Follow up.   Specialty:  General Surgery Contact information: [place] [address] [phone]            The results of significant diagnostics from this hospitalization (including imaging, microbiology, ancillary and laboratory) are listed below for reference.    Significant Diagnostic Studies: No results found.  Labs: Basic Metabolic Panel: Recent Labs  Lab 04/02/17 0546  NA 138  K 4.3  CL 107  CO2 28  GLUCOSE 141*  BUN 9  CREATININE 0.73  CALCIUM 8.7*   Liver Function Tests: Recent Labs  Lab 04/02/17 0546  AST 27  ALT 26  ALKPHOS 84  BILITOT 0.6  PROT 7.4  ALBUMIN 3.5    CBC: Recent Labs  Lab 04/01/17 1350 04/02/17 0546  WBC  --  10.2  NEUTROABS  --  7.7  HGB 11.8* 11.5*  HCT 35.8* 35.6*  MCV  --  88.1  PLT  --  321    CBG: Recent Labs  Lab 04/01/17 1314  GLUCAP 122*    Active Problems:   Chronic midline low back pain   GERD (gastroesophageal reflux disease)   Prediabetes   Obstructive sleep apnea syndrome, mild   Plantar fasciitis   Morbid obesity ([place])   S/P laparoscopic sleeve gastrectomy   Time coordinating discharge: 15 min  Signed:  [name], MD [facility], [place] 04/02/2017, 1:14 PM

## 2017-04-02 NOTE — Discharge Instructions (Signed)

## 2017-04-02 NOTE — Progress Notes (Signed)
Patient with complaints of nausea and dizziness around 1400.  Patient medicated but unable to drink fluids.  Vital signs are stable. When reassessed patient continues to have complaints of weakness, dizziness and unable to drink fluids.  Discussed with Dr [name].  Discharge cancelled  for continued  fluids and monitoring.

## 2017-04-02 NOTE — Progress Notes (Signed)
Patient alert and oriented, Post op day 1.  Provided support and encouragement.  Encouraged pulmonary toilet, ambulation and small sips of liquids.  Patient completed 12 ounces of clear fluids, started protein shakes.  All questions answered.  Will continue to monitor. 

## 2017-04-02 NOTE — Plan of Care (Signed)
Nutrition Education Note  Received consult for diet education per DROP protocol.   Discussed 2 week post op diet with pt. Emphasized that liquids must be non carbonated, non caffeinated, and sugar free. Fluid goals discussed. Pt to follow up with outpatient bariatric RD for further diet progression after 2 weeks. Multivitamins and minerals also reviewed. Teach back method used, pt expressed understanding, expect good compliance.   Diet: First 2 Weeks  You will see the nutritionist about two (2) weeks after your surgery. The nutritionist will increase the types of foods you can eat if you are handling liquids well:  If you have severe vomiting or nausea and cannot handle clear liquids lasting longer than 1 day, call your surgeon  Protein Shake  Drink at least 2 ounces of shake 5-6 times per day  Each serving of protein shakes (usually 8 - 12 ounces) should have a minimum of:  15 grams of protein  And no more than 5 grams of carbohydrate  Goal for protein each day:  Men = 80 grams per day  Women = 60 grams per day  Protein powder may be added to fluids such as non-fat milk or Lactaid milk or Soy milk (limit to 35 grams added protein powder per serving)   Hydration  Slowly increase the amount of water and other clear liquids as tolerated (See Acceptable Fluids)  Slowly increase the amount of protein shake as tolerated  Sip fluids slowly and throughout the day  May use sugar substitutes in small amounts (no more than 6 - 8 packets per day; i.e. Splenda)   Fluid Goal  The first goal is to drink at least 8 ounces of protein shake/drink per day (or as directed by the nutritionist); some examples of protein shakes are Premier Protein, Syntrax Nectar, Adkins Advantage, EAS Edge HP, and Unjury. See handout from pre-op Bariatric Education Class:  Slowly increase the amount of protein shake you drink as tolerated  You may find it easier to slowly sip shakes throughout the day  It is important to  get your proteins in first  Your fluid goal is to drink 64 - 100 ounces of fluid daily  It may take a few weeks to build up to this  32 oz (or more) should be clear liquids  And  32 oz (or more) should be full liquids (see below for examples)  Liquids should not contain sugar, caffeine, or carbonation   Clear Liquids:  Water or Sugar-free flavored water (i.e. Fruit H2O, Propel)  Decaffeinated coffee or tea (sugar-free)  Crystal Lite, Wyler?s Lite, Minute Maid Lite  Sugar-free Jell-O  Bouillon or broth  Sugar-free Popsicle: *Less than 20 calories each; Limit 1 per day   Full Liquids:  Protein Shakes/Drinks + 2 choices per day of other full liquids  Full liquids must be:  No More Than 12 grams of Carbs per serving  No More Than 3 grams of Fat per serving  Strained low-fat cream soup  Non-Fat milk  Fat-free Lactaid Milk  Sugar-free yogurt (Dannon Lite & Fit, Greek yogurt, Oikos Zero)   Cheryl Craig RD, LDN Clinical Nutrition Pager # - [phone]   

## 2017-04-03 ENCOUNTER — Encounter (HOSPITAL_COMMUNITY): Payer: Self-pay

## 2017-04-03 LAB — CBC WITH DIFFERENTIAL/PLATELET
BASOS ABS: 0 10*3/uL (ref 0.0–0.1)
BASOS PCT: 0 %
Eosinophils Absolute: 0.1 10*3/uL (ref 0.0–0.7)
Eosinophils Relative: 1 %
HEMATOCRIT: 36.6 % (ref 36.0–46.0)
HEMOGLOBIN: 11.6 g/dL — AB (ref 12.0–15.0)
Lymphocytes Relative: 46 %
Lymphs Abs: 4.4 10*3/uL — ABNORMAL HIGH (ref 0.7–4.0)
MCH: 28.5 pg (ref 26.0–34.0)
MCHC: 31.7 g/dL (ref 30.0–36.0)
MCV: 89.9 fL (ref 78.0–100.0)
Monocytes Absolute: 0.7 10*3/uL (ref 0.1–1.0)
Monocytes Relative: 7 %
NEUTROS ABS: 4.5 10*3/uL (ref 1.7–7.7)
NEUTROS PCT: 46 %
Platelets: 308 10*3/uL (ref 150–400)
RBC: 4.07 MIL/uL (ref 3.87–5.11)
RDW: 15 % (ref 11.5–15.5)
WBC: 9.7 10*3/uL (ref 4.0–10.5)

## 2017-04-03 NOTE — Progress Notes (Signed)
Patient alert and oriented, pain is controlled. Patient is tolerating fluids, advanced to protein shake today, patient is tolerating well.  Reviewed Gastric sleeve discharge instructions with patient and patient is able to articulate understanding.  Provided information on BELT program, Support Group and [facility] outpatient pharmacy. All questions answered, will continue to monitor.  

## 2017-04-03 NOTE — Consult Note (Signed)
   [facility] CM Inpatient Consult   04/03/2017  Cheryl Craig 1965/07/27 [id number]   Went to bedside to speak with [name] on behalf of [place] to [employer] program for [employer] employees/dependents with [employer].  She denies having any Link to Wellness needs. [name] agreeable to post hospital discharge call. States she remembers receiving pre op call and expressed appreciation of the follow up.   Provided 24-hr nurse line magnet,contact information, and Link to [employer].  Appreciative of visit.    Marthenia Rolling, MSN-Ed, RN,BSN [facility] Liaison [phone]

## 2017-04-03 NOTE — Progress Notes (Signed)
Patient ID: Cheryl Craig, female   DOB: [birth year], 51 y.o.   MRN: [id number]   Progress Note: Metabolic and Bariatric Surgery Service   Chief Complaint/Subjective: Had nausea yesterday afternoon and oral intake went down so dc was cancelled. Feels a lot this am. Tolerating water and shake, not having gas pain  Objective: Vital signs in last 24 hours: Temp:  [97.7 F (36.5 C)-98.5 F (36.9 C)] 98.3 F (36.8 C) (11/29 0519) Pulse Rate:  [57-66] 57 (11/29 0519) Resp:  [14-18] 14 (11/29 0519) BP: (104-146)/(41-85) 119/70 (11/29 0519) SpO2:  [94 %-100 %] 95 % (11/29 0519) Last BM Date: (PTA)  Intake/Output from previous day: 11/28 0701 - 11/29 0700 In: 3915 [P.O.:540; I.V.:3375] Out: 1550 [Urine:1550] Intake/Output this shift: No intake/output data recorded.  Lungs: cta,   Cardiovascular: reg  Abd: soft, nd, min TTP, incisions ok  Extremities: no edema, +SCDs  Neuro: nonfocal, alert, smiling  Lab Results: CBC  Recent Labs    04/02/17 0546 04/03/17 0510  WBC 10.2 9.7  HGB 11.5* 11.6*  HCT 35.6* 36.6  PLT 321 308   BMET Recent Labs    04/02/17 0546  NA 138  K 4.3  CL 107  CO2 28  GLUCOSE 141*  BUN 9  CREATININE 0.73  CALCIUM 8.7*   PT/INR No results for input(s): LABPROT, INR in the last 72 hours. ABG No results for input(s): PHART, HCO3 in the last 72 hours.  Invalid input(s): PCO2, PO2  Studies/Results:  Anti-infectives: Anti-infectives (From admission, onward)   Start     Dose/Rate Route Frequency Ordered Stop   04/01/17 0909  cefoTEtan in Dextrose 5% (CEFOTAN) IVPB 2 g     2 g Intravenous On call to O.R. 04/01/17 0909 04/01/17 1115      Medications: Scheduled Meds: . acetaminophen (TYLENOL) oral liquid 160 mg/5 mL  650 mg Oral Q6H  . enoxaparin (LOVENOX) injection  30 mg Subcutaneous Q12H  . gabapentin  200 mg Oral Q12H  . pantoprazole (PROTONIX) IV  40 mg Intravenous QHS  . protein supplement shake  2 oz Oral Q2H   Continuous  Infusions: . dextrose 5 % and 0.45 % NaCl with KCl 20 mEq/L 125 mL/hr at 04/03/17 0451   PRN Meds:.diphenhydrAMINE, morphine injection, ondansetron (ZOFRAN) IV, oxyCODONE, promethazine, simethicone  Assessment/Plan: Patient Active Problem List   Diagnosis Date Noted  . Chronic midline low back pain 04/01/2017  . GERD (gastroesophageal reflux disease) 04/01/2017  . Prediabetes 04/01/2017  . Obstructive sleep apnea syndrome, mild 04/01/2017  . Plantar fasciitis 04/01/2017  . Morbid obesity ([place]) 04/01/2017  . S/P laparoscopic sleeve gastrectomy 04/01/2017  . Vitamin D deficiency 03/04/2017   s/p Procedure(s): LAPAROSCOPIC GASTRIC SLEEVE RESECTION REPAIR OF HIATAL HERNIA  WITH UPPER ENDOSCOPY 04/01/2017  Still no fever, wbc, tachy Nausea improved. Intake better Should be able to dc today Discussed dc instructions  Disposition:  LOS: 2 days  The patient should be discharged from the hospital today  Greer Pickerel, MD [phone] [facility]

## 2017-04-03 NOTE — Progress Notes (Signed)
Reviewed AVS and discharge summary with pt. Questions were answered to her satisfaction and she verbalized understanding. Pt was discharged home in stable condition with pain controlled via WC w/all belongings.

## 2017-04-04 ENCOUNTER — Other Ambulatory Visit: Payer: Self-pay | Admitting: *Deleted

## 2017-04-04 ENCOUNTER — Encounter: Payer: Self-pay | Admitting: *Deleted

## 2017-04-04 NOTE — Patient Outreach (Addendum)
[facility]) Care Management  04/04/2017  BRITLEY GASHI February 13, 1966 [id number]   Subjective: Telephone call to patient's home / mobile number, spoke with patient, and HIPAA verified.  Discussed [facility] Care Management [facility]MR Transition of care follow up, patient voiced understanding, and is in agreement to follow up.   Patent states feeling okay, feeling as expected after surgery, tolerating full liquid diet without difficulty, and has follow up appointment with surgeon on 04/16/17.  Patient states she is able to manage self care and has assistance as needed.  Patient voices understanding of medical diagnosis, surgery, and treatment plan. [facility] benefits discussed on 03/31/17 preoperative call and patient states no additional questions at this time.  Patient states she does not have any education material, transition of care, care coordination, disease management, disease monitoring, transportation, community resource, or pharmacy needs at this time.   States she is very appreciative of the follow up and is in agreement to receive [place] Management information.    Objective:Per KPN (Knowledge Performance Now, point of care tool) and chart review,patient hospitalized 04/01/17 -04/03/17 for morbid obesity.  Status post LAPAROSCOPIC GASTRIC SLEEVE RESECTION WITH UPPER ENDOSCOPY[facility] on 04/01/17.  Patient also has a history of Prediabetes, Obstructive sleep apnea syndrome, mild, and Plantar fasciitis.     Assessment: Received [facility]MR Transition of care referral on 03/21/17.Preoperative call completed, Transition of care follow up completed, no care management needs, and will proceed with case closure.     Plan:RNCM will send patient successful outreach letter, [facility] pamphlet, and magnet. RNCM will send case closure due to follow up completed / no care management needs request to [facility] [place] [facility].    Dniya Neuhaus H. Annia Friendly, BSN, [place] Management [facility] Telephonic CM Phone: [phone] Fax: [phone]

## 2017-04-07 ENCOUNTER — Telehealth (HOSPITAL_COMMUNITY): Payer: Self-pay

## 2017-04-07 NOTE — Telephone Encounter (Signed)
Follow up with bariatric surgical patient to discuss post discharge questions.   1.  Are you having any pain not relieved by pain medication?still taking pain medication and tylenol, says helps some but still uncomfortable   2.  How much fluid total fluid intake have you had in the last 24/48 hours?  Over 40 ounces of fluid  3.  How much protein intake have you had in the last 24/48 hours?60 grams of protein 4.  Have you had any trouble making urine?no  5.  Have you had nausea that has not been relieved by nausea medication?no  6.  Are you ambulating every hour?yes  7.  Are you passing gas or had a BM?yes  8.  Do you know how to contact [place]? [facility]? [facility]DES?yes  9.  Are you taking your vitamins and calcium without difficulty?yes  10. Tell me how your incision looks?  Any redness, open incision, or drainage?look good

## 2017-04-14 ENCOUNTER — Encounter (HOSPITAL_COMMUNITY): Payer: Self-pay | Admitting: Family Medicine

## 2017-04-14 ENCOUNTER — Ambulatory Visit (HOSPITAL_COMMUNITY)
Admission: EM | Admit: 2017-04-14 | Discharge: 2017-04-14 | Disposition: A | Discharge status: Home / Self Care | Payer: 59 | Attending: Family Medicine | Admitting: Family Medicine

## 2017-04-14 DIAGNOSIS — K5903 Drug induced constipation: Secondary | ICD-10-CM

## 2017-04-14 NOTE — Discharge Instructions (Signed)
Take 2 doses of MiraLAX tonight  1 fleets enema tonight and one fleets enema in the morning Glycerin suppository tonight and glycerin suppository in the morning.

## 2017-04-14 NOTE — ED Triage Notes (Signed)
PT reports no significant BM since Friday. PT has tried milk of magnesia, miralax, and an enema. PT reports she had bariatric on 11/27. Her surgery was done at [facility].

## 2017-04-14 NOTE — ED Notes (Signed)
Chaperoned with Rectal exam

## 2017-04-14 NOTE — ED Provider Notes (Signed)
[place]   [id number] 04/14/17 Arrival Time: 1745   SUBJECTIVE:  Cheryl Craig is a 51 y.o. female who presents to the urgent care with complaint of constipation.  She has not had a bowel movement for 3 days.  She has been taking oxycodone postoperatively for a gastric sleeve operation done 2 weeks ago.  So far patient has tried milk of magnesia, 1 dose of MiraLAX, and a fleets enema.     Past Medical History:  Diagnosis Date  . GERD (gastroesophageal reflux disease)   . Obesity   . Plantar fasciitis    Family History  Problem Relation Age of Onset  . Arthritis Mother   . Diabetes Mother   . Migraines Father   . Stroke Other    Social History   Socioeconomic History  . Marital status: Married    Spouse name: Not on file  . Number of children: Not on file  . Years of education: Not on file  . Highest education level: Not on file  Social Needs  . Financial resource strain: Not on file  . Food insecurity - worry: Not on file  . Food insecurity - inability: Not on file  . Transportation needs - medical: Not on file  . Transportation needs - non-medical: Not on file  Occupational History  . Not on file  Tobacco Use  . Smoking status: Never Smoker  . Smokeless tobacco: Never Used  Substance and Sexual Activity  . Alcohol use: No    Alcohol/week: 0.0 oz  . Drug use: No  . Sexual activity: Not on file  Other Topics Concern  . Not on file  Social History Narrative  . Not on file   Current Meds  Medication Sig  . calcium-vitamin D (OSCAL WITH D) 500-200 MG-UNIT tablet Take 1 tablet by mouth 3 (three) times daily.  . Multiple Vitamins-Minerals (MULTIVITAMIN ADULT) TABS Take 1 tablet by mouth 2 (two) times daily.  . Vitamin D, Ergocalciferol, (DRISDOL) 50000 units CAPS capsule Take 1 capsule (50,000 Units total) by mouth every 7 (seven) days.  . [DISCONTINUED] oxyCODONE (ROXICODONE) 5 MG/5ML solution Take 5-10 mLs (5-10 mg total) by mouth every 4  (four) hours as needed for moderate pain or severe pain.   No Known Allergies    ROS: As per HPI, remainder of ROS negative.   OBJECTIVE:   Vitals:   04/14/17 1819 04/14/17 1820 04/14/17 1822  BP:   (!) 147/102  Pulse: 76  75  Resp: 16  16  Temp: 98.1 F (36.7 C)  98.1 F (36.7 C)  TempSrc: Oral  Oral  SpO2: 100%  100%  Weight:  246 lb (111.6 kg)   Height:  5\' 5"  (1.651 m)      General appearance: alert; no distress Eyes: PERRL; EOMI; conjunctiva normal HENT: normocephalic; atraumatic;  Neck: supple Rectal exam: Large fecalith in rectum Abdomen: soft, non-tender; bowel sounds normal; no masses or organomegaly; no guarding or rebound tenderness Back: no CVA tenderness Extremities: no cyanosis or edema; symmetrical with no gross deformities Skin: warm and dry Neurologic: normal gait; grossly normal Psychological: alert and cooperative; normal mood and affect      Labs:  Results for orders placed or performed during the hospital encounter of 04/01/17  Pregnancy, urine STAT morning of surgery  Result Value Ref Range   Preg Test, Ur NEGATIVE NEGATIVE  Hemoglobin and hematocrit, blood  Result Value Ref Range   Hemoglobin 11.8 (L) 12.0 - 15.0 g/dL  HCT 35.8 (L) 36.0 - 46.0 %  Glucose, capillary  Result Value Ref Range   Glucose-Capillary 122 (H) 65 - 99 mg/dL  CBC WITH DIFFERENTIAL  Result Value Ref Range   WBC 10.2 4.0 - 10.5 K/uL   RBC 4.04 3.87 - 5.11 MIL/uL   Hemoglobin 11.5 (L) 12.0 - 15.0 g/dL   HCT 35.6 (L) 36.0 - 46.0 %   MCV 88.1 78.0 - 100.0 fL   MCH 28.5 26.0 - 34.0 pg   MCHC 32.3 30.0 - 36.0 g/dL   RDW 14.3 11.5 - 15.5 %   Platelets 321 150 - 400 K/uL   Neutrophils Relative % 75 %   Neutro Abs 7.7 1.7 - 7.7 K/uL   Lymphocytes Relative 19 %   Lymphs Abs 1.9 0.7 - 4.0 K/uL   Monocytes Relative 6 %   Monocytes Absolute 0.6 0.1 - 1.0 K/uL   Eosinophils Relative 0 %   Eosinophils Absolute 0.0 0.0 - 0.7 K/uL   Basophils Relative 0 %    Basophils Absolute 0.0 0.0 - 0.1 K/uL  Comprehensive metabolic panel  Result Value Ref Range   Sodium 138 135 - 145 mmol/L   Potassium 4.3 3.5 - 5.1 mmol/L   Chloride 107 101 - 111 mmol/L   CO2 28 22 - 32 mmol/L   Glucose, Bld 141 (H) 65 - 99 mg/dL   BUN 9 6 - 20 mg/dL   Creatinine, Ser 0.73 0.44 - 1.00 mg/dL   Calcium 8.7 (L) 8.9 - 10.3 mg/dL   Total Protein 7.4 6.5 - 8.1 g/dL   Albumin 3.5 3.5 - 5.0 g/dL   AST 27 15 - 41 U/L   ALT 26 14 - 54 U/L   Alkaline Phosphatase 84 38 - 126 U/L   Total Bilirubin 0.6 0.3 - 1.2 mg/dL   GFR calc non Af Amer >60 >60 mL/min   GFR calc Af Amer >60 >60 mL/min   Anion gap 3 (L) 5 - 15  Glucose, capillary  Result Value Ref Range   Glucose-Capillary 90 65 - 99 mg/dL  CBC with Differential  Result Value Ref Range   WBC 9.7 4.0 - 10.5 K/uL   RBC 4.07 3.87 - 5.11 MIL/uL   Hemoglobin 11.6 (L) 12.0 - 15.0 g/dL   HCT 36.6 36.0 - 46.0 %   MCV 89.9 78.0 - 100.0 fL   MCH 28.5 26.0 - 34.0 pg   MCHC 31.7 30.0 - 36.0 g/dL   RDW 15.0 11.5 - 15.5 %   Platelets 308 150 - 400 K/uL   Neutrophils Relative % 46 %   Neutro Abs 4.5 1.7 - 7.7 K/uL   Lymphocytes Relative 46 %   Lymphs Abs 4.4 (H) 0.7 - 4.0 K/uL   Monocytes Relative 7 %   Monocytes Absolute 0.7 0.1 - 1.0 K/uL   Eosinophils Relative 1 %   Eosinophils Absolute 0.1 0.0 - 0.7 K/uL   Basophils Relative 0 %   Basophils Absolute 0.0 0.0 - 0.1 K/uL    Labs Reviewed - No data to display  No results found.     ASSESSMENT & PLAN:  1. Drug-induced constipation     No orders of the defined types were placed in this encounter.   Reviewed expectations re: course of current medical issues. Questions answered. Outlined signs and symptoms indicating need for more acute intervention. Patient verbalized understanding. After Visit Summary given.    Procedures:      Robyn Haber, MD 04/14/17 Bosie Helper

## 2017-04-15 ENCOUNTER — Encounter: Payer: 59 | Attending: General Surgery | Admitting: Skilled Nursing Facility1

## 2017-04-15 DIAGNOSIS — E669 Obesity, unspecified: Secondary | ICD-10-CM | POA: Insufficient documentation

## 2017-04-15 DIAGNOSIS — Z713 Dietary counseling and surveillance: Secondary | ICD-10-CM | POA: Insufficient documentation

## 2017-04-16 ENCOUNTER — Encounter: Payer: Self-pay | Admitting: Skilled Nursing Facility1

## 2017-04-16 NOTE — Progress Notes (Signed)
Bariatric Class:  Appt start time: 1530 end time:  1630.  2 Week Post-Operative Nutrition Class  Patient was seen on 04/15/2017 for Post-Operative Nutrition [facility].   Surgery date: 04/01/2017 Surgery type: sleeve Start weight at [facility]: 255.9 Weight today: 241.4  TANITA  BODY COMP RESULTS  04/15/2017   BMI (kg/m^2) 40.2   Fat Mass (lbs) 129.8   Fat Free Mass (lbs) 111.6   Total Body Water (lbs) 81.8   The following the learning objectives were met by the patient during this course:  Identifies Phase 3A (Soft, High Proteins) Dietary Goals and will begin from 2 weeks post-operatively to 2 months post-operatively  Identifies appropriate sources of fluids and proteins   States protein recommendations and appropriate sources post-operatively  Identifies the need for appropriate texture modifications, mastication, and bite sizes when consuming solids  Identifies appropriate multivitamin and calcium sources post-operatively  Describes the need for physical activity post-operatively and will follow MD recommendations  States when to call healthcare provider regarding medication questions or post-operative complications  Handouts given during class include:  Phase 3A: Soft, High Protein Diet Handout  Follow-Up Plan: Patient will follow-up at [facility] in 6 weeks for 2 month post-op nutrition visit for diet advancement per MD.

## 2017-04-25 MED FILL — CELEBRATE CALCL CIT CHOC 90: 30 days supply | Qty: 90 | Fill #0

## 2017-04-25 MED FILL — BARIAT ADVANT ADVMULT 60CT: 30 days supply | Qty: 60 | Fill #0

## 2017-05-27 ENCOUNTER — Encounter: Payer: 59 | Attending: General Surgery | Admitting: Registered"

## 2017-05-27 ENCOUNTER — Encounter: Payer: Self-pay | Admitting: Registered"

## 2017-05-27 DIAGNOSIS — Z713 Dietary counseling and surveillance: Secondary | ICD-10-CM | POA: Insufficient documentation

## 2017-05-27 DIAGNOSIS — E669 Obesity, unspecified: Secondary | ICD-10-CM | POA: Diagnosis not present

## 2017-05-27 NOTE — Patient Instructions (Addendum)
Goals:  Follow Phase 3B: High Protein + Non-Starchy Vegetables  Eat 3-6 small meals/snacks, every 3-5 hrs  Increase lean protein foods to meet 60g goal  Increase fluid intake to 64oz +  Avoid drinking 15 minutes before, during and 30 minutes after eating  Aim for >30 min of physical activity daily  - Track protein using app.   - Try crushing multivitamin into greek yogurt.   - Try another calcium option. Can be any brand (TUMS, Citracal, [place], [place]).

## 2017-05-27 NOTE — Progress Notes (Signed)
Follow-up visit:  8 Weeks Post-Operative Sleeve gastrectomy Surgery  Medical Nutrition Therapy:  Appt start time: 4:40 end time:  5:18.  Primary concerns today: Post-operative Bariatric Surgery Nutrition Management.  Non scale victories: drinking more water, increased energy, feel better  Surgery date: 04/01/2017 Surgery type: sleeve Start weight at [facility]: 255.9 Weight today: 226.2 lbs Weight change: 14.9 lbs loss from 241.1 lbs (04/15/2017) Total weight lost: 29.7 lbs Weight loss goal: to improve knee pain, prevent diabetes   TANITA  BODY COMP RESULTS  04/15/2017 05/27/2017   BMI (kg/m^2) 40.2 37.6   Fat Mass (lbs) 129.8 113.2   Fat Free Mass (lbs) 111.6 113.0   Total Body Water (lbs) 81.8 82.2   Pt states she is doing better with water; drinks 5-6 bottles a day, sips throughout the day. Pt states she has bowel movement once a day.   Preferred Learning Style:   No preference indicated   Learning Readiness:   Ready  Change in progress  24-hr recall: B (AM): boiled egg (6g), 1/2 [place] sausage (2g) Snk (AM): none  L (PM): tuna pack (15g), 1/2 [place] sausage (2)g Snk (PM): sometimes sausage patty (4g)  D (PM): air fried 1-2 oz chicken (14g) Snk (PM): air fried 1-2 oz chicken (14g)  Fluid intake: water with crystal light (5-6 bottles, 84-101 oz), decaf coffee (8 oz), protein shake/drink; 64+ oz Estimated total protein intake: ~57 grams  Medications: See list Supplementation: 2 Celebrate multivitamin + 3 calcium supplements  Using straws: no Drinking while eating: no Having you been chewing well: yes Chewing/swallowing difficulties: no Changes in vision: no Changes to mood/headaches: no Hair loss/Changes to skin/Changes to nails: no, no, no Any difficulty focusing or concentrating: no Sweating: no Dizziness/Lightheaded:  no Palpitations: no  Carbonated beverages: no N/V/D/C/GAS: no, no, no, taking stool softener and Miralax daily, no Abdominal Pain:  no Dumping syndrome: no Last Lap-Band fill: N/A  Recent physical activity:  Walking 60 min, 5 days/week  Progress Towards Goal(s):  In progress.  Handouts given during visit include:  Phase 3B: High protein + NS vegetables   Nutritional Diagnosis:  [place]-5.7.1 Inadequate protein intake As related to bariatric surgery post-op recommendations.  As evidenced by pt report of less than 60 grams of protein a day.    Intervention:  Nutrition education and counseling. Goals:  Follow Phase 3B: High Protein + Non-Starchy Vegetables  Eat 3-6 small meals/snacks, every 3-5 hrs  Increase lean protein foods to meet 60g goal  Increase fluid intake to 64oz +  Avoid drinking 15 minutes before, during and 30 minutes after eating  Aim for >30 min of physical activity daily  - Track protein using app.  - Try crushing multivitamin into greek yogurt.  - Try another calcium option. Can be any brand (TUMS, Citracal, [place], [place]).  Teaching Method Utilized:  Visual Auditory Hands on  Barriers to learning/adherence to lifestyle change: none identified  Demonstrated degree of understanding via:  Teach Back   Monitoring/Evaluation:  Dietary intake, exercise, lap band fills, and body weight. Follow up in 4 months for 6 month post-op visit.

## 2017-06-02 MED FILL — BARIAT ADVANT ADVMULT 60CT: 30 days supply | Qty: 60 | Fill #1

## 2017-06-02 MED FILL — CELEBRATE CALCL CIT CHOC 90: 30 days supply | Qty: 90 | Fill #1

## 2017-06-02 MED FILL — CELEBRATE CALCL CIT CHOC 90: 30 days supply | Qty: 90 | Fill #0

## 2017-07-07 MED FILL — CELEBRATE CALCL CIT CHOC 90: 30 days supply | Qty: 90 | Fill #1

## 2017-07-07 MED FILL — BARIAT ADVANT ADVMULT 60CT: 30 days supply | Qty: 60 | Fill #2

## 2017-08-05 MED FILL — CELEBRATE CALCL CIT CHOC 90: 30 days supply | Qty: 90 | Fill #0

## 2017-08-05 MED FILL — BARIAT ADVANT ADVMULT 60CT: 30 days supply | Qty: 60 | Fill #3

## 2017-08-20 DIAGNOSIS — A048 Other specified bacterial intestinal infections: Secondary | ICD-10-CM | POA: Diagnosis not present

## 2017-08-20 DIAGNOSIS — Z9884 Bariatric surgery status: Secondary | ICD-10-CM | POA: Diagnosis not present

## 2017-08-20 DIAGNOSIS — R7303 Prediabetes: Secondary | ICD-10-CM | POA: Diagnosis not present

## 2017-08-20 DIAGNOSIS — G4733 Obstructive sleep apnea (adult) (pediatric): Secondary | ICD-10-CM | POA: Diagnosis not present

## 2017-08-20 DIAGNOSIS — G8929 Other chronic pain: Secondary | ICD-10-CM | POA: Diagnosis not present

## 2017-08-20 DIAGNOSIS — M545 Low back pain: Secondary | ICD-10-CM | POA: Diagnosis not present

## 2017-09-24 ENCOUNTER — Encounter: Payer: 59 | Attending: General Surgery | Admitting: Registered"

## 2017-09-24 ENCOUNTER — Encounter: Payer: Self-pay | Admitting: Registered"

## 2017-09-24 DIAGNOSIS — M549 Dorsalgia, unspecified: Secondary | ICD-10-CM | POA: Diagnosis not present

## 2017-09-24 DIAGNOSIS — Z8261 Family history of arthritis: Secondary | ICD-10-CM | POA: Insufficient documentation

## 2017-09-24 DIAGNOSIS — K219 Gastro-esophageal reflux disease without esophagitis: Secondary | ICD-10-CM | POA: Diagnosis not present

## 2017-09-24 DIAGNOSIS — Z79899 Other long term (current) drug therapy: Secondary | ICD-10-CM | POA: Diagnosis not present

## 2017-09-24 DIAGNOSIS — Z791 Long term (current) use of non-steroidal anti-inflammatories (NSAID): Secondary | ICD-10-CM | POA: Insufficient documentation

## 2017-09-24 DIAGNOSIS — E669 Obesity, unspecified: Secondary | ICD-10-CM

## 2017-09-24 DIAGNOSIS — Z713 Dietary counseling and surveillance: Secondary | ICD-10-CM | POA: Diagnosis not present

## 2017-09-24 DIAGNOSIS — M1990 Unspecified osteoarthritis, unspecified site: Secondary | ICD-10-CM | POA: Diagnosis not present

## 2017-09-24 NOTE — Progress Notes (Signed)
Follow-up visit: 6 Months Post-Operative Sleeve gastrectomy Surgery  Medical Nutrition Therapy:  Appt start time: 5:00 end time:  5:25  Primary concerns today: Post-operative Bariatric Surgery Nutrition Management.  Non scale victories: drinking more water, increased energy, feel better, less knee pain, more active, running with sons  Surgery date: 04/01/2017 Surgery type: Sleeve Start weight at [facility]: 255.9 Weight today: 207.8 lbs Weight change: 18.4 lbs loss from 226.2 lbs (05/27/2017) Total weight lost: 48.1 lbs Weight loss goal: to improve knee pain, prevent diabetes, be more active  TANITA  BODY COMP RESULTS  04/15/2017 05/27/2017 09/24/2017   BMI (kg/m^2) 40.2 37.6 34.6   Fat Mass (lbs) 129.8 113.2 94.4   Fat Free Mass (lbs) 111.6 113.0 113.4   Total Body Water (lbs) 81.8 82.2 81.8    Pt states she is doing well. Pt states she has not had any problems. Pt states she is continuing to learn about her body. Pt states she is doing well with fluid intake. Pt states she did not like [facility] multivitamin. Pt states she wants to add some fruit into her regimen because she loves watermelon. Pt states she has increased her physical activity walking with her sons in the afternoon along with walking during her lunch break.   Next visit: add starchy vegaebltes   Preferred Learning Style:   No preference indicated   Learning Readiness:   Ready  Change in progress  24-hr recall: B (AM): boiled egg (6-12g), 1 [place] sausage (2g) Snk (AM): none  L (PM): salad with [place] + cheese (14-21g) or tuna pack (15g), 1/2 [place] sausage (2)g Snk (PM): nuts (7g) D (PM): baked chicken + green beans + 1 c pinto beans or air fried 1-2 oz chicken (14g) or fish + vegetables Snk (PM): nuts (7g)  Fluid intake: water (10-11 16.9 oz bottles), decaf coffee (8 oz), protein shake/drink; 64+ oz Estimated total protein intake: ~60+ grams  Medications: See list Supplementation: 2 Celebrate  multivitamin + 3 calcium supplements  Using straws: no Drinking while eating: no Having you been chewing well: yes Chewing/swallowing difficulties: no Changes in vision: no Changes to mood/headaches: no Hair loss/Changes to skin/Changes to nails: no, no, no Any difficulty focusing or concentrating: no Sweating: no Dizziness/Lightheaded:  no Palpitations: no  Carbonated beverages: no N/V/D/C/GAS: no, no, no, taking stool softener and Miralax daily, no Abdominal Pain: no Dumping syndrome: no Last Lap-Band fill: N/A  Recent physical activity:  Walking 75 min, 5 days/week  Progress Towards Goal(s):  In progress.  Handouts given during visit include:  none   Nutritional Diagnosis:  [facility]-3.3 Overweight/obesity related to past poor dietary habits and physical inactivity as evidenced by patient w/ recent sleeve gastrectomy surgery following dietary guidelines for continued weight loss.     Intervention:  Nutrition education and counseling. Pt was educated and counseled on how to add in fruit into her daily regimen. Pt was also encouraged to continue with habits already established such as chewing well, listening to her body, meeting daily fluid and protein goals, continuing with physical activity, and taking vitamins and minerals. RD informed pt to try bariatric-specific capsule multivitamin if this will work better for her.  Goals: - Eat no more than 1 piece of fruit a day.  - Aim to eat protein + non-starchy vegetables + fruit.  - Keep up with the great habits already established!  Teaching Method Utilized:  Visual Auditory Hands on  Barriers to learning/adherence to lifestyle change: none identified  Demonstrated degree of  understanding via:  Teach Back   Monitoring/Evaluation:  Dietary intake, exercise, lap band fills, and body weight. Follow up in 3 months for 9 month post-op visit.

## 2017-09-24 NOTE — Patient Instructions (Addendum)
-   Eat no more than 1 piece of fruit a day.   - Aim to eat protein + non-starchy vegetables + fruit.   - Keep up with the great habits already established!

## 2017-12-16 DIAGNOSIS — H524 Presbyopia: Secondary | ICD-10-CM | POA: Diagnosis not present

## 2017-12-16 DIAGNOSIS — H5213 Myopia, bilateral: Secondary | ICD-10-CM | POA: Diagnosis not present

## 2017-12-16 DIAGNOSIS — H52221 Regular astigmatism, right eye: Secondary | ICD-10-CM | POA: Diagnosis not present

## 2017-12-24 ENCOUNTER — Encounter: Payer: Self-pay | Admitting: Registered"

## 2017-12-24 ENCOUNTER — Encounter: Payer: 59 | Attending: General Surgery | Admitting: Registered"

## 2017-12-24 DIAGNOSIS — Z9884 Bariatric surgery status: Secondary | ICD-10-CM | POA: Diagnosis not present

## 2017-12-24 DIAGNOSIS — Z713 Dietary counseling and surveillance: Secondary | ICD-10-CM | POA: Diagnosis not present

## 2017-12-24 DIAGNOSIS — E669 Obesity, unspecified: Secondary | ICD-10-CM

## 2017-12-24 DIAGNOSIS — Z6833 Body mass index (BMI) 33.0-33.9, adult: Secondary | ICD-10-CM | POA: Insufficient documentation

## 2017-12-24 NOTE — Progress Notes (Signed)
Follow-up visit: 9 Months Post-Operative Sleeve gastrectomy Surgery  Medical Nutrition Therapy:  Appt start time: 4:50 end time: 5:19  Primary concerns today: Post-operative Bariatric Surgery Nutrition Management.  Non scale victories: drinking more water, increased energy, feel better, no more knee pain, more active, running with sons, can cross legs without discomfort  Surgery date: 04/01/2017 Surgery type: Sleeve Start weight at [facility]: 255.9 Weight today: 199.6 lbs Weight change: 8.2 lbs loss from 207.8 lbs (09/24/2017) Total weight lost: 56.3 lbs Weight loss goal: to improve knee pain, prevent diabetes, be more active  TANITA  BODY COMP RESULTS  04/15/2017 05/27/2017 09/24/2017 12/24/2017   BMI (kg/m^2) 40.2 37.6 34.6 33.2   Fat Mass (lbs) 129.8 113.2 94.4 88.0   Fat Free Mass (lbs) 111.6 113.0 113.4 111.6   Total Body Water (lbs) 81.8 82.2 81.8 80.0    Pt states she is doing well. Pt states she is planning to start going to gym to add strength-training to regimen.   Pt states she has not had any problems. Pt states she is continuing to learn about her body. Pt states she is doing well with fluid intake.   Preferred Learning Style:   No preference indicated   Learning Readiness:   Ready  Change in progress  24-hr recall: B (AM): boiled egg (6-12g), 1 [place] sausage (2g) Snk (AM): none  L (PM): salad with [place] + cheese (14-21g) or tuna pack (15g), 1/2 [place] sausage (2)g Snk (PM): nuts (7g) or watermelon D (PM): baked/grilled chicken + green beans + 1 c pinto beans or air fried 1-2 oz chicken (14g) or fish + vegetables Snk (PM): nuts (7g)  Fluid intake: water (10-11 16.9 oz bottles), decaf coffee (2-4 oz); 64+ oz Estimated total protein intake: ~60+ grams  Medications: See list Supplementation: 2 Celebrate multivitamin + 3 calcium supplements  Using straws: no Drinking while eating: no Having you been chewing well: yes Chewing/swallowing difficulties:  no Changes in vision: no Changes to mood/headaches: no Hair loss/Changes to skin/Changes to nails: no, no, no Any difficulty focusing or concentrating: no Sweating: no Dizziness/Lightheaded:  no Palpitations: no  Carbonated beverages: no N/V/D/C/GAS: no, no, no, taking stool softener and Miralax daily, no Abdominal Pain: no Dumping syndrome: no Last Lap-Band fill: N/A  Recent physical activity:  Walking 75 min, 5 days/week and   Progress Towards Goal(s):  In progress.  Handouts given during visit include:  Phase V: High Protein + All vegetables   Nutritional Diagnosis:  [place]-3.3 Overweight/obesity related to past poor dietary habits and physical inactivity as evidenced by patient w/ recent sleeve gastrectomy surgery following dietary guidelines for continued weight loss.     Intervention:  Nutrition education and counseling. Pt was educated and counseled on strategies to adding starchy vegetables to regimen. Pt was educated and counseled on alternative calcium supplements and the benefits of BELT program. Pt was in agreement with goals listed.  Goals: - Alternative calcium options can be: TUMS, Viactiv, Citracal, [place], store brand, etc. Can purchase over-the-counter.  - Check into attending BELT program.  - Keep up the great work!  Teaching Method Utilized:  Visual Auditory Hands on  Barriers to learning/adherence to lifestyle change: none identified  Demonstrated degree of understanding via:  Teach Back   Monitoring/Evaluation:  Dietary intake, exercise, lap band fills, and body weight. Follow up in 3 months for 12 month post-op visit.

## 2017-12-24 NOTE — Patient Instructions (Addendum)
-   Alternative calcium options can be: TUMS, Viactiv, Citracal, [employer], store brand, etc. Can purchase over-the-counter.   - Check into attending BELT program.   - Keep up the great work!

## 2018-03-26 ENCOUNTER — Encounter: Payer: Self-pay | Admitting: Registered"

## 2018-03-26 ENCOUNTER — Encounter: Payer: 59 | Attending: General Surgery | Admitting: Registered"

## 2018-03-26 DIAGNOSIS — Z6832 Body mass index (BMI) 32.0-32.9, adult: Secondary | ICD-10-CM | POA: Insufficient documentation

## 2018-03-26 DIAGNOSIS — E669 Obesity, unspecified: Secondary | ICD-10-CM

## 2018-03-26 DIAGNOSIS — Z713 Dietary counseling and surveillance: Secondary | ICD-10-CM | POA: Diagnosis not present

## 2018-03-26 NOTE — Progress Notes (Signed)
Follow-up visit: 12 Months Post-Operative Sleeve gastrectomy Surgery  Medical Nutrition Therapy:  Appt start time: 4:45 end time: 5:15  Primary concerns today: Post-operative Bariatric Surgery Nutrition Management.  Non scale victories: drinking more water, increased energy, feel better, no more knee pain, more active, running with sons, can cross legs without discomfort  Surgery date: 04/01/2017 Surgery type: Sleeve Start weight at [facility]: 255.9 Weight today: 196.8 lbs Weight change: ~3 lbs loss from 199.6 lbs (12/24/2017) Total weight lost: 59.1 lbs Weight loss goal: to improve knee pain, prevent diabetes, be more active  TANITA  BODY COMP RESULTS  04/15/2017 05/27/2017 09/24/2017 12/24/2017 03/26/2018   BMI (kg/m^2) 40.2 37.6 34.6 33.2 32.7   Fat Mass (lbs) 129.8 113.2 94.4 88.0 85.2   Fat Free Mass (lbs) 111.6 113.0 113.4 111.6 111.6   Total Body Water (lbs) 81.8 82.2 81.8 80.0 80.0    Pt states her dad's health declined after our previous visit and he passed away last week. Pt states she is doing well. Pt states she has been staying away from breads and sweets; reports not being a sweets person. Pt states she was unable to get connected with BELT due to care-taking for her dad; plans to contact them soon.   Pt states she is doing well.   Pt states she has not had any problems. Pt states she is continuing to learn about her body.   Preferred Learning Style:   No preference indicated   Learning Readiness:   Ready  Change in progress  24-hr recall: B (AM): boiled egg (6-12g), 1 [place] sausage (2g) Snk (AM): none  L (PM): salad with [place] + cheese (14-21g) or tuna pack (15g), 1/2 [place] sausage (2)g Snk (PM): nuts (7g) or watermelon D (PM): baked/grilled chicken + green beans + 1 c pinto beans or air fried 1-2 oz chicken (14g) or fish + green beans + corn Snk (PM): nuts (7g)  Fluid intake: water (10-11 16.9 oz bottles), decaf coffee (2-4 oz), sometimes with crystal  light; 50-64+ oz Estimated total protein intake: ~60+ grams  Medications: See list Supplementation: 2 Bariatric Advtanage multivitamin + 3 calcium supplements  Using straws: no Drinking while eating: no Having you been chewing well: yes Chewing/swallowing difficulties: no Changes in vision: no Changes to mood/headaches: no Hair loss/Changes to skin/Changes to nails: no, no, no Any difficulty focusing or concentrating: no Sweating: no Dizziness/Lightheaded:  no Palpitations: no  Carbonated beverages: no N/V/D/C/GAS: no, no, no, taking stool softener and Miralax daily, no Abdominal Pain: no Dumping syndrome: no Last Lap-Band fill: N/A  Recent physical activity: ADLs  Progress Towards Goal(s):  In progress.  Handouts given during visit include:  Phase VII: Maintenance Phase   Nutritional Diagnosis:  [place]-3.3 Overweight/obesity related to past poor dietary habits and physical inactivity as evidenced by patient w/ recent sleeve gastrectomy surgery following dietary guidelines for continued weight loss.     Intervention:  Nutrition education and counseling. Pt was educated and counseled on the next phase of the diet, physical activity, and BELT program. Pt was in agreement with goals listed.  Goals: - Get connected with BELT program. - Keep up the great work!  Teaching Method Utilized:  Visual Auditory Hands on  Barriers to learning/adherence to lifestyle change: none identified  Demonstrated degree of understanding via:  Teach Back   Monitoring/Evaluation:  Dietary intake, exercise, lap band fills, and body weight. Follow up in 6 months for 18 month post-op visit.

## 2018-03-26 NOTE — Patient Instructions (Addendum)
-   Get connected with BELT program.

## 2018-03-31 DIAGNOSIS — N959 Unspecified menopausal and perimenopausal disorder: Secondary | ICD-10-CM | POA: Diagnosis not present

## 2018-03-31 DIAGNOSIS — J309 Allergic rhinitis, unspecified: Secondary | ICD-10-CM | POA: Diagnosis not present

## 2018-03-31 DIAGNOSIS — Z136 Encounter for screening for cardiovascular disorders: Secondary | ICD-10-CM | POA: Diagnosis not present

## 2018-03-31 DIAGNOSIS — Z0000 Encounter for general adult medical examination without abnormal findings: Secondary | ICD-10-CM | POA: Diagnosis not present

## 2018-03-31 DIAGNOSIS — E669 Obesity, unspecified: Secondary | ICD-10-CM | POA: Diagnosis not present

## 2018-03-31 DIAGNOSIS — Z131 Encounter for screening for diabetes mellitus: Secondary | ICD-10-CM | POA: Diagnosis not present

## 2018-04-27 ENCOUNTER — Other Ambulatory Visit: Payer: Self-pay | Admitting: Family Medicine

## 2018-04-27 DIAGNOSIS — Z1231 Encounter for screening mammogram for malignant neoplasm of breast: Secondary | ICD-10-CM

## 2018-05-05 ENCOUNTER — Ambulatory Visit: Payer: 59

## 2018-05-07 ENCOUNTER — Ambulatory Visit
Admission: RE | Admit: 2018-05-07 | Discharge: 2018-05-07 | Disposition: A | Discharge status: Home / Self Care | Payer: 59 | Source: Ambulatory Visit | Attending: Family Medicine | Admitting: Family Medicine

## 2018-05-07 ENCOUNTER — Ambulatory Visit: Payer: 59

## 2018-05-07 DIAGNOSIS — Z1231 Encounter for screening mammogram for malignant neoplasm of breast: Secondary | ICD-10-CM

## 2018-05-29 DIAGNOSIS — Z9884 Bariatric surgery status: Secondary | ICD-10-CM | POA: Diagnosis not present

## 2018-05-29 DIAGNOSIS — A048 Other specified bacterial intestinal infections: Secondary | ICD-10-CM | POA: Diagnosis not present

## 2018-05-29 DIAGNOSIS — R7303 Prediabetes: Secondary | ICD-10-CM | POA: Diagnosis not present

## 2018-05-29 MED FILL — AMOXICILLIN 500 MG CAPSULE: 500 | 14 days supply | Qty: 56 | Fill #0

## 2018-05-29 MED FILL — CLARITHROMYCIN 500 MG TAB: 500 | 14 days supply | Qty: 28 | Fill #0

## 2018-05-29 MED FILL — PANTOPRAZOLE SOD DR 40 MG T: 40 | 14 days supply | Qty: 28 | Fill #0

## 2018-06-11 MED FILL — AMOXICILLIN 500 MG CAPSULE: 500 | 14 days supply | Qty: 56 | Fill #0

## 2018-06-11 MED FILL — CLARITHROMYCIN 500 MG TAB: 500 | 14 days supply | Qty: 28 | Fill #0

## 2018-06-11 MED FILL — PANTOPRAZOLE SOD DR 40 MG T: 40 | 14 days supply | Qty: 28 | Fill #0

## 2018-08-28 ENCOUNTER — Telehealth: Payer: 59 | Admitting: Physician Assistant

## 2018-08-28 DIAGNOSIS — R21 Rash and other nonspecific skin eruption: Secondary | ICD-10-CM

## 2018-08-28 DIAGNOSIS — L819 Disorder of pigmentation, unspecified: Secondary | ICD-10-CM | POA: Diagnosis not present

## 2018-08-28 DIAGNOSIS — K146 Glossodynia: Secondary | ICD-10-CM | POA: Diagnosis not present

## 2018-08-28 NOTE — Progress Notes (Signed)
Based on what you shared with me, I feel your condition warrants further evaluation and I recommend that you be seen for a face to face office visit. You should contact your primary care doctor to schedule an appointment.    NOTE: If you entered your credit card information for this eVisit, you will not be charged. You may see a "hold" on your card for the $35 but that hold will drop off and you will not have a charge processed.  If you are having a true medical emergency please call 911.  If you need an urgent face to face visit, [facility] has four urgent care centers for your convenience.    Your e-visit answers were reviewed by a board certified advanced clinical practitioner to complete your personal care plan.  Thank you for using e-Visits.  I have spent 5 minutes in review of e-visit questionnaire, review and updating patient chart, medical decision making and response to patient.    Tenna Delaine, PA-C

## 2018-09-18 MED FILL — BARIAT ADVANT ADVMULT 60CT: 60 days supply | Qty: 60 | Fill #0

## 2018-09-23 ENCOUNTER — Ambulatory Visit: Payer: 59 | Admitting: Skilled Nursing Facility1

## 2018-10-14 DIAGNOSIS — K146 Glossodynia: Secondary | ICD-10-CM | POA: Diagnosis not present

## 2019-04-06 ENCOUNTER — Encounter (HOSPITAL_COMMUNITY): Payer: Self-pay

## 2019-04-06 ENCOUNTER — Other Ambulatory Visit: Payer: Self-pay

## 2019-04-06 ENCOUNTER — Ambulatory Visit (INDEPENDENT_AMBULATORY_CARE_PROVIDER_SITE_OTHER): Payer: 59

## 2019-04-06 ENCOUNTER — Ambulatory Visit (HOSPITAL_COMMUNITY)
Admission: EM | Admit: 2019-04-06 | Discharge: 2019-04-06 | Disposition: A | Discharge status: Home / Self Care | Payer: 59 | Attending: Family Medicine | Admitting: Family Medicine

## 2019-04-06 ENCOUNTER — Telehealth: Payer: Self-pay | Admitting: *Deleted

## 2019-04-06 DIAGNOSIS — M25561 Pain in right knee: Secondary | ICD-10-CM

## 2019-04-06 DIAGNOSIS — M25562 Pain in left knee: Secondary | ICD-10-CM

## 2019-04-06 MED ORDER — MELOXICAM 15 MG PO TABS: 15.0000 mg | ORAL_TABLET | Freq: Every day | ORAL | 0 refills | Status: DC

## 2019-04-06 MED FILL — MELOXICAM 15 MG TABLET: 15 | 14 days supply | Qty: 14 | Fill #0

## 2019-04-06 NOTE — ED Triage Notes (Signed)
Patient presents to Urgent Care with complaints of bilateral upper leg swelling since about 5 days ago. Patient reports she also noticed some blotchy discoloration on the posterior left leg as well.Marland Kitchen

## 2019-04-06 NOTE — Telephone Encounter (Signed)
Copied from [place] [id number]. Topic: General - Other >> Apr 06, 2019 10:55 AM Celene Kras wrote: Reason for CRM: Pt called stating she would like to be placed on waiting list for new patient appt with Dr. Volanda Napoleon. Please advise.

## 2019-04-07 MED FILL — BARIAT ADVANT ADVMULT 60CT: 60 days supply | Qty: 60 | Fill #1

## 2019-04-07 MED FILL — CELEB CALCIUM SOFT CHW CHOC: 90 days supply | Qty: 90 | Fill #0

## 2019-04-07 NOTE — Telephone Encounter (Signed)
Pt appointment was changed to 05/13/2019 at 9.30 am

## 2019-04-07 NOTE — ED Provider Notes (Signed)
Cheryl Craig   [id number] 04/06/19 Arrival Time: [id number]  ASSESSMENT & PLAN:  1. Acute pain of both knees     I have personally viewed the imaging studies ordered this visit. Mild osteoarthritic changes. Otherwise no abnormalities appreciated. Discussed.  Begin trial of: Meds ordered this encounter  Medications  . meloxicam (MOBIC) 15 MG tablet    Sig: Take 1 tablet (15 mg total) by mouth daily.    Dispense:  14 tablet    Refill:  0   No sign of joint infection. Discussed early venous stasis changes to her lower legs. No sign of DVT. Reassured. WBAT.  Recommend: Follow-up Information    Schedule an appointment as soon as possible for a visit  with [facility].   Contact information: [address] [place] [id number]          Reviewed expectations re: course of current medical issues. Questions answered. Outlined signs and symptoms indicating need for more acute intervention. Patient verbalized understanding. After Visit Summary given.  SUBJECTIVE: History from: patient. Cheryl Craig is a 53 y.o. female who reports intermittent moderate pain of her bilateral knees; R>L; described as aching and "a pain that feels like it's deep inside my knee" (right); without radiation. H/O mild and similar symptoms; sporadic; usually does not limit her. Much more noticeable knee pain (esp R) over the past week. Injury/trama: none reported. Symptoms have progressed to a point and plateaued since beginning. Aggravating factors: certain movements, weight bearing and prolonged walking/standing. No giving out or locking of knees reported. Alleviating factors: rest. Associated symptoms: none reported. Extremity sensation changes or weakness: none. Self treatment: OTC analgesics with minimal relief.  No calf swelling or pain reported. She is worried over the darkening skin over bilateral lower extremities. Unsure  of when she noticed this.  Past Surgical History:  Procedure Laterality Date  . CESAREAN SECTION    . LAPAROSCOPIC GASTRIC SLEEVE RESECTION N/A 04/01/2017   Procedure: LAPAROSCOPIC GASTRIC SLEEVE RESECTION REPAIR OF HIATAL HERNIA  WITH UPPER ENDOSCOPY;  Surgeon: Greer Pickerel, MD;  Location: [facility];  Service: General;  Laterality: N/A;     ROS: As per HPI. All other systems negative.    OBJECTIVE:  Vitals:   04/06/19 1521  BP: (!) 116/57  Pulse: 68  Resp: 15  Temp: 98.3 F (36.8 C)  TempSrc: Oral  SpO2: 100%    General appearance: alert; no distress; obese HEENT: [place]; AT Neck: supple with FROM Resp: unlabored respirations Extremities (obesity limits exam): without calf swelling or tenderness . LLE: warm with well perfused appearance; no specific or localized tenderness over left knee; without gross deformities; swelling: none; bruising: none; knee ROM: normal without reported discomfort . RLE: warm with well perfused appearance; poorly localized mild tenderness over right anterior knee "but feels deeper"; without gross deformities; swelling: none; bruising: none; knee ROM: normal with reported mild discomfort on extension CV: brisk extremity capillary refill of bilateral LE; 2+ DP/PT pulses of bilateral LE. Skin: warm and dry; no visible rashes; mild venous stasis skin changes over bilateral LE Neurologic: gait normal; normal reflexes of bilateral LE; normal sensation of bilateral LE; normal strength of bilateral LE Psychological: alert and cooperative; normal mood and affect  Imaging: Dg Knee Complete 4 Views Right  Result Date: 04/06/2019 CLINICAL DATA:  Right knee pain and tightness for 8 days with swelling. No reported injury. EXAM: RIGHT KNEE - COMPLETE 4+ VIEW COMPARISON:  None. FINDINGS:  No fracture or dislocation. Small suprapatellar right knee joint effusion. Mild medial compartment and minimal lateral and patellofemoral compartment osteoarthritis. No suspicious focal  osseous lesions. No radiopaque foreign bodies. IMPRESSION: Small suprapatellar right knee joint effusion. Mild tricompartmental right knee osteoarthritis, most prominent in the medial compartment. Electronically Signed   By: Ilona Sorrel M.D.   On: 04/06/2019 16:32    No Known Allergies  Past Medical History:  Diagnosis Date  . GERD (gastroesophageal reflux disease)   . Obesity   . Plantar fasciitis    Social History   Socioeconomic History  . Marital status: Married    Spouse name: Not on file  . Number of children: Not on file  . Years of education: Not on file  . Highest education level: Not on file  Occupational History  . Not on file  Social Needs  . Financial resource strain: Not on file  . Food insecurity    Worry: Not on file    Inability: Not on file  . Transportation needs    Medical: Not on file    Non-medical: Not on file  Tobacco Use  . Smoking status: Never Smoker  . Smokeless tobacco: Never Used  Substance and Sexual Activity  . Alcohol use: Yes    Alcohol/week: 0.0 standard drinks    Comment: very rare  . Drug use: No  . Sexual activity: Not on file  Lifestyle  . Physical activity    Days per week: Not on file    Minutes per session: Not on file  . Stress: Not on file  Relationships  . Social Herbalist on phone: Not on file    Gets together: Not on file    Attends religious service: Not on file    Active member of club or organization: Not on file    Attends meetings of clubs or organizations: Not on file    Relationship status: Not on file  Other Topics Concern  . Not on file  Social History Narrative  . Not on file   Family History  Problem Relation Age of Onset  . Arthritis Mother   . Diabetes Mother   . Migraines Father   . Stroke Other   . Breast cancer Neg Hx    Past Surgical History:  Procedure Laterality Date  . CESAREAN SECTION    . LAPAROSCOPIC GASTRIC SLEEVE RESECTION N/A 04/01/2017   Procedure: LAPAROSCOPIC  GASTRIC SLEEVE RESECTION REPAIR OF HIATAL HERNIA  WITH UPPER ENDOSCOPY;  Surgeon: Greer Pickerel, MD;  Location: [facility];  Service: General;  Laterality: Ginette Pitman, MD 04/07/19 [phone]

## 2019-05-13 ENCOUNTER — Encounter: Payer: Self-pay | Admitting: Family Medicine

## 2019-05-13 ENCOUNTER — Ambulatory Visit: Payer: 59 | Admitting: Family Medicine

## 2019-05-13 ENCOUNTER — Other Ambulatory Visit: Payer: Self-pay

## 2019-05-13 VITALS — BP 98/64 | HR 68 | Temp 97.7°F | Ht 64.0 in | Wt 226.0 lb

## 2019-05-13 DIAGNOSIS — M25561 Pain in right knee: Secondary | ICD-10-CM

## 2019-05-13 DIAGNOSIS — G8929 Other chronic pain: Secondary | ICD-10-CM | POA: Diagnosis not present

## 2019-05-13 DIAGNOSIS — L819 Disorder of pigmentation, unspecified: Secondary | ICD-10-CM | POA: Diagnosis not present

## 2019-05-13 DIAGNOSIS — Z9884 Bariatric surgery status: Secondary | ICD-10-CM

## 2019-05-13 DIAGNOSIS — Z7689 Persons encountering health services in other specified circumstances: Secondary | ICD-10-CM

## 2019-05-13 DIAGNOSIS — R252 Cramp and spasm: Secondary | ICD-10-CM

## 2019-05-13 LAB — COMPREHENSIVE METABOLIC PANEL
ALT: 12 U/L (ref 0–35)
AST: 15 U/L (ref 0–37)
Albumin: 4.1 g/dL (ref 3.5–5.2)
Alkaline Phosphatase: 79 U/L (ref 39–117)
BUN: 13 mg/dL (ref 6–23)
CO2: 29 mEq/L (ref 19–32)
Calcium: 9.5 mg/dL (ref 8.4–10.5)
Chloride: 106 mEq/L (ref 96–112)
Creatinine, Ser: 0.83 mg/dL (ref 0.40–1.20)
GFR: 87.57 mL/min (ref >60.00)
Glucose, Bld: 88 mg/dL (ref 70–99)
Potassium: 4.2 mEq/L (ref 3.5–5.1)
Sodium: 141 mEq/L (ref 135–145)
Total Bilirubin: 0.6 mg/dL (ref 0.2–1.2)
Total Protein: 7.2 g/dL (ref 6.0–8.3)

## 2019-05-13 LAB — PROTIME-INR
INR: 1 ratio (ref 0.8–1.0)
Prothrombin Time: 12 s (ref 9.6–13.1)

## 2019-05-13 LAB — VITAMIN B12: Vitamin B-12: 1413 pg/mL — ABNORMAL HIGH (ref 211–911)

## 2019-05-13 LAB — VITAMIN D 25 HYDROXY (VIT D DEFICIENCY, FRACTURES): VITD: 58.52 ng/mL (ref 30.00–100.00)

## 2019-05-13 LAB — CBC WITH DIFFERENTIAL/PLATELET
Basophils Absolute: 0 10*3/uL (ref 0.0–0.1)
Basophils Relative: 0.2 % (ref 0.0–3.0)
Eosinophils Absolute: 0.2 10*3/uL (ref 0.0–0.7)
Eosinophils Relative: 2.2 % (ref 0.0–5.0)
HCT: 36.6 % (ref 36.0–46.0)
Hemoglobin: 12.3 g/dL (ref 12.0–15.0)
Lymphocytes Relative: 48.7 % — ABNORMAL HIGH (ref 12.0–46.0)
Lymphs Abs: 3.3 10*3/uL (ref 0.7–4.0)
MCHC: 33.6 g/dL (ref 30.0–36.0)
MCV: 89.7 fl (ref 78.0–100.0)
Monocytes Absolute: 0.5 10*3/uL (ref 0.1–1.0)
Monocytes Relative: 7.3 % (ref 3.0–12.0)
Neutro Abs: 2.8 10*3/uL (ref 1.4–7.7)
Neutrophils Relative %: 41.6 % — ABNORMAL LOW (ref 43.0–77.0)
Platelets: 303 10*3/uL (ref 150.0–400.0)
RBC: 4.08 Mil/uL (ref 3.87–5.11)
RDW: 14 % (ref 11.5–15.5)
WBC: 6.8 10*3/uL (ref 4.0–10.5)

## 2019-05-13 LAB — SEDIMENTATION RATE: Sed Rate: 21 mm/hr (ref 0–30)

## 2019-05-13 NOTE — Progress Notes (Signed)
Patient presents to clinic today to establish care.  SUBJECTIVE: PMH: Pt is a 54 yo female with pmg sig for GERD, s/p lap gastric sleeve, knee pain.  Pt previously seen at [facility] by Kelton Pillar, MD.  Pt notes several ongoing concerns. Pt notes intermittent leg cramps, a painful bump on her tongue, and discoloration of LEs b/l x months.  Pt denies LE edema, pruritis, pain.  CBC, TSH, and B12 were normal 10/14/18 per records provided.    Knee pain: -R knee pain x 3-4 months -unable to exercise 2/2 pain -[facility]ray at [facility] with arthritis per pt -seen by [facility] -tried Tylenol, Mobic, heat  S/p gastric sleeve: -done in 2018 -taking MVIs -unable to exercise 2/2 knee pain  Allergies: NKDA  Past surgical hx: Lap gastric sleeve 2018 c-section (twins)  Social hx: Pt is married.  She has twins.  Pt works at [employer].  Pt denies tobacco, EtOH, and drug use.  Health Maintenance: Colonoscopy -- due 12/31/2017 Mammogram --due 05/07/2020 PAP -- due 01/15/2020 Immunizations--Tdap given 08/02/2010  Past Medical History:  Diagnosis Date  . GERD (gastroesophageal reflux disease)   . Obesity   . Plantar fasciitis     Past Surgical History:  Procedure Laterality Date  . CESAREAN SECTION    . LAPAROSCOPIC GASTRIC SLEEVE RESECTION N/A 04/01/2017   Procedure: LAPAROSCOPIC GASTRIC SLEEVE RESECTION REPAIR OF HIATAL HERNIA  WITH UPPER ENDOSCOPY;  Surgeon: Greer Pickerel, MD;  Location: [facility];  Service: General;  Laterality: N/A;    Current Outpatient Medications on File Prior to Visit  Medication Sig Dispense Refill  . acetaminophen (TYLENOL) 325 MG tablet Take 650 mg by mouth every 6 (six) hours as needed for moderate pain or fever.    . calcium-vitamin D (OSCAL WITH D) 500-200 MG-UNIT tablet Take 1 tablet by mouth 3 (three) times daily. 90 tablet 1  . meloxicam (MOBIC) 15 MG tablet Take 1 tablet (15 mg total) by mouth daily. 14 tablet 0  . Multiple Vitamins-Minerals  (MULTIVITAMIN ADULT) TABS Take 1 tablet by mouth 2 (two) times daily. 60 tablet 3  . Vitamin D, Ergocalciferol, (DRISDOL) 50000 units CAPS capsule Take 1 capsule (50,000 Units total) by mouth every 7 (seven) days. 12 capsule 0   No current facility-administered medications on file prior to visit.    No Known Allergies  Family History  Problem Relation Age of Onset  . Arthritis Mother   . Diabetes Mother   . Migraines Father   . Stroke Other   . Breast cancer Neg Hx     Social History   Socioeconomic History  . Marital status: Married    Spouse name: Not on file  . Number of children: Not on file  . Years of education: Not on file  . Highest education level: Not on file  Occupational History  . Not on file  Tobacco Use  . Smoking status: Never Smoker  . Smokeless tobacco: Never Used  Substance and Sexual Activity  . Alcohol use: Yes    Alcohol/week: 0.0 standard drinks    Comment: very rare  . Drug use: No  . Sexual activity: Not on file  Other Topics Concern  . Not on file  Social History Narrative  . Not on file   Social Determinants of Health   Financial Resource Strain:   . Difficulty of Paying Living Expenses: Not on file  Food Insecurity:   . Worried About Charity fundraiser in the Last Year:  Not on file  . Ran Out of Food in the Last Year: Not on file  Transportation Needs:   . Lack of Transportation (Medical): Not on file  . Lack of Transportation (Non-Medical): Not on file  Physical Activity:   . Days of Exercise per Week: Not on file  . Minutes of Exercise per Session: Not on file  Stress:   . Feeling of Stress : Not on file  Social Connections:   . Frequency of Communication with Friends and Family: Not on file  . Frequency of Social Gatherings with Friends and Family: Not on file  . Attends Religious Services: Not on file  . Active Member of Clubs or Organizations: Not on file  . Attends Archivist Meetings: Not on file  . Marital  Status: Not on file  Intimate Partner Violence:   . Fear of Current or Ex-Partner: Not on file  . Emotionally Abused: Not on file  . Physically Abused: Not on file  . Sexually Abused: Not on file    ROS General: Denies fever, chills, night sweats, changes in weight, changes in appetite HEENT: Denies headaches, ear pain, changes in vision, rhinorrhea, sore throat  +h/o sore on tongue CV: Denies CP, palpitations, SOB, orthopnea Pulm: Denies SOB, cough, wheezing GI: Denies abdominal pain, nausea, vomiting, diarrhea, constipation GU: Denies dysuria, hematuria, frequency, vaginal discharge Msk: Denies joint pains  +muscle cramps, knee pain Neuro: Denies weakness, numbness, tingling Skin: Denies rashes, bruising  +discoloration of LEs Psych: Denies depression, anxiety, hallucinations  BP 98/64 (BP Location: Left Arm, Patient Position: Sitting, Cuff Size: Large)   Pulse 68   Temp 97.7 F (36.5 C) (Temporal)   Ht 5\' 4"  (1.626 m)   Wt 226 lb (102.5 kg)   LMP 09/03/2018   SpO2 99%   BMI 38.79 kg/m   Physical Exam Gen. Pleasant, well developed, well-nourished, in NAD HEENT - [place]/AT, PERRL, EOMI, no scleral icterus, no nasal drainage, pharynx without erythema or exudate.  TMs normal b/l Lungs: no use of accessory muscles, CTAB, no wheezes, rales or rhonchi Cardiovascular: RRR, No r/g/m, 1+ pitting edema of b/l LEs and ankle, trace at knee. Abdomen: BS present, soft, nontender,nondistended Musculoskeletal: No TTP of b/l knees at joint line.  No crepitus or edema of b/l knees.  No deformities, moves all four extremities, no cyanosis or clubbing, normal tone Neuro:  A&Ox3, CN II-XII intact, normal gait Skin:  Warm, dry, intact.  Hyperpigmentation on b/l LEs in a flat, reticulated/splotchy pattern.    No results found for this or any previous visit (from the past 2160 hour(s)).  Assessment/Plan: Chronic pain of right knee  -2/2 mild OA -Xray R knee 04/06/19 with Small suprapatellar R knee  joint effusion.  Mild tricompartmental R knee OA, most prominent in medial compartment. -supportive care -OTC Voltaren gel -continue f/u with Ortho prn -per chart review MRI R knee 11/02/2016 with partial thickness radial tear involving the posterior horn of the medial meniscus with slight medial protrusion.  Intact ligamentous structure and no acute bone findings.  Tricompartmental degenerative changes most significant in the medial compartment.  Large joint effusion and mild to moderate synovitis. - Plan: CBC with Differential/Platelet  Discoloration of skin of lower leg  -Consider clotting d/o, vitamin def., autoimmune d/o -will obtain labs -will review previous records - Plan: CBC with Differential/Platelet, CMP, Protime-INR, Sedimentation Rate  History of bariatric surgery  -continue daily MVIs - Plan: Vitamin B12, Vitamin D, 25-hydroxy  Muscle cramps  - Plan:  CMP  Encounter to establish care -We reviewed the PMH, PSH, FH, SH, Meds and Allergies. -We provided refills for any medications we will prescribe as needed. -We addressed current concerns per orders and patient instructions. -We have asked for records for pertinent exams, studies, vaccines and notes from previous providers. -We have advised patient to follow up per instructions below. -Last pap 01/14/2017, HPV negative per records review.  F/u in 1 month  Grier Mitts, MD

## 2019-05-13 NOTE — Patient Instructions (Addendum)
You can get Voltaren gel over-the-counter at your local drugstore, [employer], [facility].  Follow the instructions enclosed in the packaging.  Arthritis Arthritis is a term that is commonly used to refer to joint pain or joint disease. There are more than 100 types of arthritis. What are the causes? The most common cause of this condition is wear and tear of a joint. Other causes include:  Gout.  Inflammation of a joint.  An infection of a joint.  Sprains and other injuries near the joint.  A reaction to medicines or drugs, or an allergic reaction. In some cases, the cause may not be known. What are the signs or symptoms? The main symptom of this condition is pain in the joint during movement. Other symptoms include:  Redness, swelling, or stiffness at a joint.  Warmth coming from the joint.  Fever.  Overall feeling of illness. How is this diagnosed? This condition may be diagnosed with a physical exam and tests, including:  Blood tests.  Urine tests.  Imaging tests, such as X-rays, an MRI, or a CT scan. Sometimes, fluid is removed from a joint for testing. How is this treated? This condition may be treated with:  Treatment of the cause, if it is known.  Rest.  Raising (elevating) the joint.  Applying cold or hot packs to the joint.  Medicines to improve symptoms and reduce inflammation.  Injections of a steroid such as cortisone into the joint to help reduce pain and inflammation. Depending on the cause of your arthritis, you may need to make lifestyle changes to reduce stress on your joint. Changes may include:  Exercising more.  Losing weight. Follow these instructions at home: Medicines  Take over-the-counter and prescription medicines only as told by your health care provider.  Do not take aspirin to relieve pain if your health care provider thinks that gout may be causing your pain. Activity  Rest your joint if told by your health care provider. Rest is  important when your disease is active and your joint feels painful, swollen, or stiff.  Avoid activities that make the pain worse. It is important to balance activity with rest.  Exercise your joint regularly with range-of-motion exercises as told by your health care provider. Try doing low-impact exercise, such as: ? Swimming. ? Water aerobics. ? Biking. ? Walking. Managing pain, stiffness, and swelling      If directed, put ice on the joint. ? Put ice in a plastic bag. ? Place a towel between your skin and the bag. ? Leave the ice on for 20 minutes, 2-3 times per day.  If your joint is swollen, raise (elevate) it above the level of your heart if directed by your health care provider.  If your joint feels stiff in the morning, try taking a warm shower.  If directed, apply heat to the affected area as often as told by your health care provider. Use the heat source that your health care provider recommends, such as a moist heat pack or a heating pad. If you have diabetes, do not apply heat without permission from your health care provider. To apply heat: ? Place a towel between your skin and the heat source. ? Leave the heat on for 20-30 minutes. ? Remove the heat if your skin turns bright red. This is especially important if you are unable to feel pain, heat, or cold. You may have a greater risk of getting burned. General instructions  Do not use any products that contain nicotine  or tobacco, such as cigarettes, e-cigarettes, and chewing tobacco. If you need help quitting, ask your health care provider.  Keep all follow-up visits as told by your health care provider. This is important. Contact a health care provider if:  The pain gets worse.  You have a fever. Get help right away if:  You develop severe joint pain, swelling, or redness.  Many joints become painful and swollen.  You develop severe back pain.  You develop severe weakness in your leg.  You cannot control  your bladder or bowels. Summary  Arthritis is a term that is commonly used to refer to joint pain or joint disease. There are more than 100 types of arthritis.  The most common cause of this condition is wear and tear of a joint. Other causes include gout, inflammation or infection of the joint, sprains, or allergies.  Symptoms of this condition include redness, swelling, or stiffness of the joint. Other symptoms include warmth, fever, or feeling ill.  This condition is treated with rest, elevation, medicines, and applying cold or hot packs.  Follow your health care provider's instructions about medicines, activity, exercises, and other home care treatments. This information is not intended to replace advice given to you by your health care provider. Make sure you discuss any questions you have with your health care provider. Document Revised: 03/30/2018 Document Reviewed: 03/30/2018 Elsevier Patient Education  [place].  Leg Cramps Leg cramps occur when one or more muscles tighten and you have no control over this tightening (involuntary muscle contraction). Muscle cramps can develop in any muscle, but the most common place is in the calf muscles of the leg. Those cramps can occur during exercise or when you are at rest. Leg cramps are painful, and they may last for a few seconds to a few minutes. Cramps may return several times before they finally stop. Usually, leg cramps are not caused by a serious medical problem. In many cases, the cause is not known. Some common causes include:  Excessive physical effort (overexertion), such as during intense exercise.  Overuse from repetitive motions, or doing the same thing over and over.  Staying in a certain position for a long period of time.  Improper preparation, form, or technique while performing a sport or an activity.  Dehydration.  Injury.  Side effects of certain medicines.  Abnormally low levels of minerals in your  blood (electrolytes), especially potassium and calcium. This could result from: ? Pregnancy. ? Taking diuretic medicines. Follow these instructions at home: Eating and drinking  Drink enough fluid to keep your urine pale yellow. Staying hydrated may help prevent cramps.  Eat a healthy diet that includes plenty of nutrients to help your muscles function. A healthy diet includes fruits and vegetables, lean protein, whole grains, and low-fat or nonfat dairy products. Managing pain, stiffness, and swelling      Try massaging, stretching, and relaxing the affected muscle. Do this for several minutes at a time.  If directed, put ice on areas that are sore or painful after a cramp: ? Put ice in a plastic bag. ? Place a towel between your skin and the bag. ? Leave the ice on for 20 minutes, 2-3 times a day.  If directed, apply heat to muscles that are tense or tight. Do this before you exercise, or as often as told by your health care provider. Use the heat source that your health care provider recommends, such as a moist heat pack or a heating  pad. ? Place a towel between your skin and the heat source. ? Leave the heat on for 20-30 minutes. ? Remove the heat if your skin turns bright red. This is especially important if you are unable to feel pain, heat, or cold. You may have a greater risk of getting burned.  Try taking hot showers or baths to help relax tight muscles. General instructions  If you are having frequent leg cramps, avoid intense exercise for several days.  Take over-the-counter and prescription medicines only as told by your health care provider.  Keep all follow-up visits as told by your health care provider. This is important. Contact a health care provider if:  Your leg cramps get more severe or more frequent, or they do not improve over time.  Your foot becomes cold, numb, or blue. Summary  Muscle cramps can develop in any muscle, but the most common place is in  the calf muscles of the leg.  Leg cramps are painful, and they may last for a few seconds to a few minutes.  Usually, leg cramps are not caused by a serious medical problem. Often, the cause is not known.  Stay hydrated and take over-the-counter and prescription medicines only as told by your health care provider. This information is not intended to replace advice given to you by your health care provider. Make sure you discuss any questions you have with your health care provider. Document Revised: 04/04/2017 Document Reviewed: 01/30/2017 Elsevier Patient Education  2020 [place]  Canker sores are small, painful sores that develop inside your mouth. You can get one or more canker sores on the inside of your lips or cheeks, on your tongue, or anywhere inside your mouth. Canker sores cannot be passed from person to person (are not contagious). These sores are different from the sores that you may get on the outside of your lips (cold sores or fever blisters). What are the causes? The cause of this condition is not known. The condition may be passed down from a parent (genetic). What increases the risk? This condition is more likely to develop in:  Women.  People in their teens or 25s.  Women who are having their menstrual period.  People who are under a lot of emotional stress.  People who do not get enough iron or B vitamins.  People who do not take care of their mouth and teeth (have poor oral hygiene).  People who have an injury inside the mouth, such as after having dental work or from chewing something hard. What are the signs or symptoms? Canker sores usually start as painful red bumps. Then they turn into small white, yellow, or gray sores that have red borders. The sores may be painful, and the pain may get worse when you eat or drink. Along with the canker sore, symptoms may also include:  Fever.  Fatigue.  Swollen lymph nodes in your neck. How is  this diagnosed? This condition may be diagnosed based on your symptoms and an exam of the inside of your mouth. If you get canker sores often or if they are very bad, you may have tests, such as:  Blood tests to rule out possible causes.  Swabbing a fluid sample from the sore to be tested for infection.  Removing a small tissue sample from the sore (biopsy) to test it for cancer. How is this treated? Most canker sores go away without treatment in about 1 week. Home care is usually the  only treatment that you will need. Over-the-counter medicines can relieve discomfort. If you have severe canker sores, your health care provider may prescribe:  Numbing ointment to relieve pain. ? Do not use numbing gels or products containing benzocaine in children who are 48 years of age or younger.  Vitamins.  Steroid medicines. These may be given as pills, mouth rinses, or gels.  Antibiotic mouth rinse. Follow these instructions at home:   Apply, take, or use over-the-counter and prescription medicines only as told by your health care provider. These include vitamins and ointments.  If you were prescribed an antibiotic mouth rinse, use it as told by your health care provider. Do not stop using the antibiotic even if your condition improves.  Until the sores are healed: ? Do not drink coffee or citrus juices. ? Do not eat spicy or salty foods.  Use a mild, over-the-counter mouth rinse as recommended by your health care provider.  Practice good oral hygiene by: ? Flossing your teeth every day. ? Brushing your teeth with a soft toothbrush twice each day. Contact a health care provider if:  Your symptoms do not get better after 2 weeks.  You also have a fever or swollen glands in your neck.  You get canker sores often.  You have a canker sore that is getting larger.  You cannot eat or drink due to your canker sores. Summary  Canker sores are small, painful sores that develop inside your  mouth.  Canker sores usually start as painful red bumps that turn into small white, yellow, or gray sores that have red borders.  The sores may be quite painful, and the pain may get worse when you eat or drink.  Most canker sores clear up without treatment in about 1 week. Home care is usually the only treatment that you will need. Over-the-counter medicines can relieve discomfort. This information is not intended to replace advice given to you by your health care provider. Make sure you discuss any questions you have with your health care provider. Document Revised: 04/04/2017 Document Reviewed: 01/27/2017 Elsevier Patient Education  2020 [employer].

## 2019-05-17 ENCOUNTER — Encounter: Payer: Self-pay | Admitting: Family Medicine

## 2019-05-17 DIAGNOSIS — M25561 Pain in right knee: Secondary | ICD-10-CM | POA: Insufficient documentation

## 2019-05-17 DIAGNOSIS — G8929 Other chronic pain: Secondary | ICD-10-CM | POA: Insufficient documentation

## 2019-05-17 DIAGNOSIS — L819 Disorder of pigmentation, unspecified: Secondary | ICD-10-CM | POA: Insufficient documentation

## 2019-05-24 ENCOUNTER — Telehealth: Payer: Self-pay

## 2019-05-28 NOTE — Telephone Encounter (Signed)
Pt should continue taking the MVI with calcium BID given h/o gastric surgery.

## 2019-05-31 NOTE — Telephone Encounter (Signed)
Pt verbalized understanding of Dr Volanda Napoleon recommendations

## 2019-06-09 ENCOUNTER — Ambulatory Visit: Payer: 59 | Admitting: Family Medicine

## 2019-09-20 ENCOUNTER — Other Ambulatory Visit: Payer: Self-pay | Admitting: Family Medicine

## 2019-09-20 ENCOUNTER — Telehealth: Payer: Self-pay | Admitting: Family Medicine

## 2019-09-20 DIAGNOSIS — Z1231 Encounter for screening mammogram for malignant neoplasm of breast: Secondary | ICD-10-CM

## 2019-09-20 NOTE — Telephone Encounter (Signed)
Spoke with Cheryl Craig advised that labs for Lipids and A1C was not done, Cheryl Craig requested to scheduled a f/u appointment with Dr Volanda Napoleon for discoloration on her legs and also for labs

## 2019-09-20 NOTE — Telephone Encounter (Signed)
Pt would like to know what her last lipids and A1C was not able to see it from her MyChart.  Pt would like to have a call back today to let her know.

## 2019-09-22 ENCOUNTER — Other Ambulatory Visit: Payer: Self-pay

## 2019-09-22 ENCOUNTER — Ambulatory Visit (INDEPENDENT_AMBULATORY_CARE_PROVIDER_SITE_OTHER): Payer: 59 | Admitting: Family Medicine

## 2019-09-22 ENCOUNTER — Encounter: Payer: Self-pay | Admitting: Family Medicine

## 2019-09-22 VITALS — BP 102/60 | Temp 97.7°F

## 2019-09-22 DIAGNOSIS — G8929 Other chronic pain: Secondary | ICD-10-CM

## 2019-09-22 DIAGNOSIS — M1990 Unspecified osteoarthritis, unspecified site: Secondary | ICD-10-CM

## 2019-09-22 DIAGNOSIS — L819 Disorder of pigmentation, unspecified: Secondary | ICD-10-CM | POA: Diagnosis not present

## 2019-09-22 DIAGNOSIS — M25561 Pain in right knee: Secondary | ICD-10-CM | POA: Diagnosis not present

## 2019-09-22 DIAGNOSIS — Z9884 Bariatric surgery status: Secondary | ICD-10-CM

## 2019-09-22 DIAGNOSIS — K148 Other diseases of tongue: Secondary | ICD-10-CM | POA: Diagnosis not present

## 2019-09-22 DIAGNOSIS — M25562 Pain in left knee: Secondary | ICD-10-CM

## 2019-09-22 MED ORDER — MAGIC MOUTHWASH: ORAL | 0 refills | Status: DC

## 2019-09-22 NOTE — Patient Instructions (Signed)
Chronic Knee Pain, Adult Chronic knee pain is pain in one or both knees that lasts longer than 3 months. Symptoms of chronic knee pain may include swelling, stiffness, and discomfort. Age-related wear and tear (osteoarthritis) of the knee joint is the most common cause of chronic knee pain. Other possible causes include:  A long-term immune-related disease that causes inflammation of the knee (rheumatoid arthritis). This usually affects both knees.  Inflammatory arthritis, such as gout or pseudogout.  An injury to the knee that causes arthritis.  An injury to the knee that damages the ligaments. Ligaments are strong tissues that connect bones to each other.  Runner's knee or pain behind the kneecap. Treatment for chronic knee pain depends on the cause. The main treatments for chronic knee pain are physical therapy and weight loss. This condition may also be treated with medicines, injections, a knee sleeve or brace, and by using crutches. Rest, ice, compression (pressure), and elevation (RICE) therapy may also be recommended. Follow these instructions at home: If you have a knee sleeve or brace:   Wear it as told by your health care provider. Remove it only as told by your health care provider.  Loosen it if your toes tingle, become numb, or turn cold and blue.  Keep it clean.  If the sleeve or brace is not waterproof: ? Do not let it get wet. ? Remove it if allowed by your health care provider, or cover it with a watertight covering when you take a bath or a shower. Managing pain, stiffness, and swelling      If directed, apply heat to the affected area as often as told by your health care provider. Use the heat source that your health care provider recommends, such as a moist heat pack or a heating pad. ? If you have a removable sleeve or brace, remove it as told by your health care provider. ? Place a towel between your skin and the heat source. ? Leave the heat on for 20-30  minutes. ? Remove the heat if your skin turns bright red. This is especially important if you are unable to feel pain, heat, or cold. You may have a greater risk of getting burned.  If directed, put ice on the affected area. ? If you have a removable sleeve or brace, remove it as told by your health care provider. ? Put ice in a plastic bag. ? Place a towel between your skin and the bag. ? Leave the ice on for 20 minutes, 2-3 times a day.  Move your toes often to reduce stiffness and swelling.  Raise (elevate) the injured area above the level of your heart while you are sitting or lying down. Activity  Avoid activities where both feet leave the ground at the same time (high-impact activities). Examples are running, jumping rope, and doing jumping jacks.  Return to your normal activities as told by your health care provider. Ask your health care provider what activities are safe for you.  Follow the exercise plan that your health care provider designed for you. Your health care provider may suggest that you: ? Avoid activities that make knee pain worse. This may require you to change your exercise routines, sport participation, or job duties. ? Wear shoes with cushioned soles. ? Avoid high-impact activities or sports that require running and sudden changes in direction. ? Do physical therapy as told by your health care provider. Physical therapy is planned to match your needs and abilities. It may include  exercises for strength, flexibility, stability, and endurance. ? Do exercises that increase balance and strength, such as tai chi and yoga.  Do not use the injured limb to support your body weight until your health care provider says that you can. Use crutches, a cane, or a walker, as told by your health care provider. General instructions  Take over-the-counter and prescription medicines only as told by your health care provider.  Lose weight if you are overweight. Losing even a little  weight can reduce knee pain. Ask your health care provider what your ideal weight is, and how to safely lose extra weight. A food expert (dietitian) may be able to help you plan your meals.  Do not use any products that contain nicotine or tobacco, such as cigarettes, e-cigarettes, and chewing tobacco. These can delay healing. If you need help quitting, ask your health care provider.  Keep all follow-up visits as told by your health care provider. This is important. Contact a health care provider if:  You have knee pain that is not getting better or gets worse.  You are unable to do your physical therapy exercises due to knee pain. Get help right away if:  Your knee swells and the swelling becomes worse.  You cannot move your knee.  You have severe knee pain. Summary  Knee pain that lasts more than 3 months is considered chronic knee pain.  The main treatments for chronic knee pain are physical therapy and weight loss. You may also need to take medicines, wear a knee sleeve or brace, use crutches, and apply ice or heat.  Losing even a little weight can reduce knee pain. Ask your health care provider what your ideal weight is, and how to safely lose extra weight. A food expert (dietitian) may be able to help you plan your meals.  Work with a physical therapist to make a safe exercise program, as told by your health care provider. This information is not intended to replace advice given to you by your health care provider. Make sure you discuss any questions you have with your health care provider. Document Revised: 07/02/2018 Document Reviewed: 07/02/2018 Elsevier Patient Education  [place] is a term that is commonly used to refer to joint pain or joint disease. There are more than 100 types of arthritis. What are the causes? The most common cause of this condition is wear and tear of a joint. Other causes include:  Gout.  Inflammation of a  joint.  An infection of a joint.  Sprains and other injuries near the joint.  A reaction to medicines or drugs, or an allergic reaction. In some cases, the cause may not be known. What are the signs or symptoms? The main symptom of this condition is pain in the joint during movement. Other symptoms include:  Redness, swelling, or stiffness at a joint.  Warmth coming from the joint.  Fever.  Overall feeling of illness. How is this diagnosed? This condition may be diagnosed with a physical exam and tests, including:  Blood tests.  Urine tests.  Imaging tests, such as X-rays, an MRI, or a CT scan. Sometimes, fluid is removed from a joint for testing. How is this treated? This condition may be treated with:  Treatment of the cause, if it is known.  Rest.  Raising (elevating) the joint.  Applying cold or hot packs to the joint.  Medicines to improve symptoms and reduce inflammation.  Injections of a steroid such as  cortisone into the joint to help reduce pain and inflammation. Depending on the cause of your arthritis, you may need to make lifestyle changes to reduce stress on your joint. Changes may include:  Exercising more.  Losing weight. Follow these instructions at home: Medicines  Take over-the-counter and prescription medicines only as told by your health care provider.  Do not take aspirin to relieve pain if your health care provider thinks that gout may be causing your pain. Activity  Rest your joint if told by your health care provider. Rest is important when your disease is active and your joint feels painful, swollen, or stiff.  Avoid activities that make the pain worse. It is important to balance activity with rest.  Exercise your joint regularly with range-of-motion exercises as told by your health care provider. Try doing low-impact exercise, such as: ? Swimming. ? Water aerobics. ? Biking. ? Walking. Managing pain, stiffness, and swelling       If directed, put ice on the joint. ? Put ice in a plastic bag. ? Place a towel between your skin and the bag. ? Leave the ice on for 20 minutes, 2-3 times per day.  If your joint is swollen, raise (elevate) it above the level of your heart if directed by your health care provider.  If your joint feels stiff in the morning, try taking a warm shower.  If directed, apply heat to the affected area as often as told by your health care provider. Use the heat source that your health care provider recommends, such as a moist heat pack or a heating pad. If you have diabetes, do not apply heat without permission from your health care provider. To apply heat: ? Place a towel between your skin and the heat source. ? Leave the heat on for 20-30 minutes. ? Remove the heat if your skin turns bright red. This is especially important if you are unable to feel pain, heat, or cold. You may have a greater risk of getting burned. General instructions  Do not use any products that contain nicotine or tobacco, such as cigarettes, e-cigarettes, and chewing tobacco. If you need help quitting, ask your health care provider.  Keep all follow-up visits as told by your health care provider. This is important. Contact a health care provider if:  The pain gets worse.  You have a fever. Get help right away if:  You develop severe joint pain, swelling, or redness.  Many joints become painful and swollen.  You develop severe back pain.  You develop severe weakness in your leg.  You cannot control your bladder or bowels. Summary  Arthritis is a term that is commonly used to refer to joint pain or joint disease. There are more than 100 types of arthritis.  The most common cause of this condition is wear and tear of a joint. Other causes include gout, inflammation or infection of the joint, sprains, or allergies.  Symptoms of this condition include redness, swelling, or stiffness of the joint. Other  symptoms include warmth, fever, or feeling ill.  This condition is treated with rest, elevation, medicines, and applying cold or hot packs.  Follow your health care provider's instructions about medicines, activity, exercises, and other home care treatments. This information is not intended to replace advice given to you by your health care provider. Make sure you discuss any questions you have with your health care provider. Document Revised: 03/30/2018 Document Reviewed: 03/30/2018 Elsevier Patient Education  2020 [employer].

## 2019-09-22 NOTE — Progress Notes (Signed)
Subjective:    Patient ID: Cheryl Craig, female    DOB: [birth year], 54 y.o.   MRN: [id number]  No chief complaint on file.   HPI Patient was seen today for f/u.  Pt with discoloration on LEs present x 1 yr.  Intermittently fades and becomes darker in color.  Pt denies itching or rash on other areas of body.  Pt also with lesion on tip of tongue.  Always present, at times becomes larger in size.  Cannot correlate any foods with symptoms.  Denies injury.  Pt also with b/l knee pain and edema.  States she does not know what is wrong with them.  Pt endorses having injections in the past that did not work.  Pt inquires why her cholesterol was not checked at last OFV.  Pt kindly reminded she was not fasting.   Past Medical History:  Diagnosis Date  . GERD (gastroesophageal reflux disease)   . Obesity   . Plantar fasciitis     No Known Allergies  ROS General: Denies fever, chills, night sweats, changes in weight, changes in appetite HEENT: Denies headaches, ear pain, changes in vision, rhinorrhea, sore throat CV: Denies CP, palpitations, SOB, orthopnea Pulm: Denies SOB, cough, wheezing GI: Denies abdominal pain, nausea, vomiting, diarrhea, constipation GU: Denies dysuria, hematuria, frequency, vaginal discharge Msk: Denies muscle cramps, joint pains  +b/l knee pain Neuro: Denies weakness, numbness, tingling Skin: Denies rashes, bruising  +lesion on tongue, discoloration of skin Psych: Denies depression, anxiety, hallucinations    Objective:    Blood pressure 102/60, temperature 97.7 F (36.5 C), temperature source Temporal, SpO2 98 %.  Gen. Pleasant, well-nourished, in no distress, normal affect   HEENT: [place]/AT, face symmetric, no scleral icterus, PERRLA, EOMI, nares patent without drainage,  Lungs: no accessory muscle use, CTAB, no wheezes or rales Cardiovascular: RRR, no m/r/g, no peripheral edema Musculoskeletal: B/l knee edema without effusion or TTP of joint line.  No  deformities, no cyanosis or clubbing, normal tone Neuro:  A&Ox3, CN II-XII intact, normal gait Skin:  Warm, dry, intact. Nonblanchable reticular discoloration of b/l LEs inferior to knees.  Skin discoloration lighter in color than pervious visit.   Wt Readings from Last 3 Encounters:  05/13/19 226 lb (102.5 kg)  03/01/17 254 lb (115.2 kg)  02/18/17 255 lb (115.7 kg)    Lab Results  Component Value Date   WBC 6.8 05/13/2019   HGB 12.3 05/13/2019   HCT 36.6 05/13/2019   PLT 303.0 05/13/2019   GLUCOSE 88 05/13/2019   ALT 12 05/13/2019   AST 15 05/13/2019   NA 141 05/13/2019   K 4.2 05/13/2019   CL 106 05/13/2019   CREATININE 0.83 05/13/2019   BUN 13 05/13/2019   CO2 29 05/13/2019   INR 1.0 05/13/2019    Assessment/Plan:  Chronic pain of both knees  -per review of this provider's last OFV note:  " X-ray right knee 04/06/2019 with small suprapatellar right knee joint effusion.  Mild tricompartmental right knee OA, most prominent in medial compartment. -MRI R knee 11/02/16 with partial thickness radial tear involving the posterior horn of the medial meniscus with slight medial protrusion.  Intact ligamentous structure and no acute bone findings.  Tricompartmental degenerative changes most significantly medial compartment.  Large joint effusion and mild to moderate synovitis. -pt strongly encouraged to f/u with Ortho.  In the past seen by Cassie Freer - Plan: Ambulatory referral to Orthopedic Surgery  Arthritis  -MRI R knee 11/02/16 with partial thickness radial  tear involving the posterior horn of the medial meniscus with slight medial protrusion.  Intact ligamentous structure and no acute bone findings.  Tricompartmental degenerative changes most significantly medial compartment.  Large joint effusion and mild to moderate synovitis. - Plan: Ambulatory referral to Orthopedic Surgery, ANA, C3, C4, CRP, RF  Tongue lesion  -given duration of lesion will refer for biopsy as possible  malignancy on ddx. -Can use magic mouth wash for discomfort - Plan: Ambulatory referral to Dentistry, magic mouthwash SOLN  Discoloration of skin of lower leg -given pattern consider Erythema ab igne.  Will inquire if pt is using a heater under desk at work, then skin improves when away from heat. -also consider autoimmune etiology. -Plan: ANA, C3, C4, CRP, RF  S/p lap sleeve gastrectomy -continue bariatric MVIs -avoid NASAIDs  F/u prn  Grier Mitts, MD

## 2019-09-23 ENCOUNTER — Telehealth: Payer: Self-pay | Admitting: Family Medicine

## 2019-09-23 NOTE — Telephone Encounter (Signed)
Patient is wanting her thyroids checked tomorrow when she comes in for labs.

## 2019-09-24 ENCOUNTER — Other Ambulatory Visit: Payer: Self-pay

## 2019-09-24 ENCOUNTER — Telehealth: Payer: Self-pay | Admitting: Family Medicine

## 2019-09-24 ENCOUNTER — Encounter: Payer: Self-pay | Admitting: Family Medicine

## 2019-09-24 ENCOUNTER — Other Ambulatory Visit: Payer: 59

## 2019-09-24 DIAGNOSIS — Z1329 Encounter for screening for other suspected endocrine disorder: Secondary | ICD-10-CM

## 2019-09-24 DIAGNOSIS — M1990 Unspecified osteoarthritis, unspecified site: Secondary | ICD-10-CM | POA: Diagnosis not present

## 2019-09-24 DIAGNOSIS — G8929 Other chronic pain: Secondary | ICD-10-CM | POA: Diagnosis not present

## 2019-09-24 DIAGNOSIS — M25561 Pain in right knee: Secondary | ICD-10-CM | POA: Diagnosis not present

## 2019-09-24 DIAGNOSIS — M25562 Pain in left knee: Secondary | ICD-10-CM | POA: Diagnosis not present

## 2019-09-24 LAB — LIPID PANEL
Cholesterol: 222 mg/dL — ABNORMAL HIGH (ref 0–200)
HDL: 83.4 mg/dL (ref >39.00)
LDL Cholesterol: 124 mg/dL — ABNORMAL HIGH (ref 0–99)
NonHDL: 138.25
Total CHOL/HDL Ratio: 3
Triglycerides: 70 mg/dL (ref 0.0–149.0)
VLDL: 14 mg/dL (ref 0.0–40.0)

## 2019-09-24 LAB — C-REACTIVE PROTEIN: CRP: 1.1 mg/dL (ref 0.5–20.0)

## 2019-09-24 MED FILL — MAGIC MW DPH/DEX/NYS 1:1:1: 2 days supply | Qty: 100 | Fill #0

## 2019-09-24 NOTE — Telephone Encounter (Signed)
Patient returned Nancy's call from earlier today.

## 2019-09-24 NOTE — Telephone Encounter (Signed)
Pt called back. Pt can be reached at [phone]

## 2019-09-28 LAB — C3 AND C4
C3 Complement: 162 mg/dL (ref 83–193)
C4 Complement: 42 mg/dL (ref 15–57)

## 2019-09-28 LAB — ANTI-NUCLEAR AB-TITER (ANA TITER): ANA Titer 1: 1:80 {titer} — ABNORMAL HIGH

## 2019-09-28 LAB — ANA: Anti Nuclear Antibody (ANA): POSITIVE — AB

## 2019-09-28 LAB — RHEUMATOID FACTOR: Rhuematoid fact SerPl-aCnc: <14 IU/mL (ref <14)

## 2019-09-28 NOTE — Telephone Encounter (Signed)
Spoke with pt aware to call office and schedule for a TSH labs, pt states that she is out of town and she will call to scheduled when back in town. Pt was also advised that the office will call her with her lab results from last week

## 2019-09-28 NOTE — Telephone Encounter (Signed)
Message already responded to nothing further needed

## 2019-09-30 ENCOUNTER — Ambulatory Visit
Admission: RE | Admit: 2019-09-30 | Discharge: 2019-09-30 | Disposition: A | Discharge status: Home / Self Care | Payer: 59 | Source: Ambulatory Visit | Attending: Family Medicine | Admitting: Family Medicine

## 2019-09-30 ENCOUNTER — Other Ambulatory Visit: Payer: Self-pay

## 2019-09-30 DIAGNOSIS — Z1231 Encounter for screening mammogram for malignant neoplasm of breast: Secondary | ICD-10-CM

## 2019-10-01 ENCOUNTER — Other Ambulatory Visit: Payer: Self-pay | Admitting: Family Medicine

## 2019-10-01 DIAGNOSIS — R768 Other specified abnormal immunological findings in serum: Secondary | ICD-10-CM

## 2019-10-06 ENCOUNTER — Other Ambulatory Visit: Payer: Self-pay

## 2019-10-06 ENCOUNTER — Ambulatory Visit: Payer: 59 | Admitting: Orthopaedic Surgery

## 2019-10-06 ENCOUNTER — Encounter: Payer: Self-pay | Admitting: Orthopaedic Surgery

## 2019-10-06 VITALS — Ht 64.0 in | Wt 226.0 lb

## 2019-10-06 DIAGNOSIS — M1711 Unilateral primary osteoarthritis, right knee: Secondary | ICD-10-CM | POA: Diagnosis not present

## 2019-10-06 DIAGNOSIS — M1712 Unilateral primary osteoarthritis, left knee: Secondary | ICD-10-CM | POA: Diagnosis not present

## 2019-10-06 MED ORDER — BUPIVACAINE HCL 0.5 % IJ SOLN
2.0000 mL | INTRAMUSCULAR | Status: AC | PRN
  Administered 2019-10-06: 2 mL via INTRA_ARTICULAR

## 2019-10-06 MED ORDER — LIDOCAINE HCL 1 % IJ SOLN
2.0000 mL | INTRAMUSCULAR | Status: AC | PRN
  Administered 2019-10-06: 2 mL

## 2019-10-06 MED ORDER — METHYLPREDNISOLONE ACETATE 40 MG/ML IJ SUSP
40.0000 mg | INTRAMUSCULAR | Status: AC | PRN
  Administered 2019-10-06: 40 mg via INTRA_ARTICULAR

## 2019-10-06 MED ORDER — MELOXICAM 7.5 MG PO TABS: 7.5000 mg | ORAL_TABLET | Freq: Two times a day (BID) | ORAL | 2 refills | Status: DC | PRN

## 2019-10-06 MED FILL — MELOXICAM 7.5 MG TABLET: 7.5 | 15 days supply | Qty: 30 | Fill #0

## 2019-10-06 NOTE — Progress Notes (Signed)
Office Visit Note   Patient: Cheryl Craig           Date of Birth: June 11, 1965           MRN: [id number] Visit Date: 10/06/2019              Requested by: Billie Ruddy, MD [place],  [place] [address] PCP: Billie Ruddy, MD   Assessment & Plan: Visit Diagnoses:  1. Primary osteoarthritis of right knee   2. Primary osteoarthritis of left knee     Plan: Impression is bilateral knee osteoarthritis exacerbation.  I reviewed the x-rays from PACS and these were reviewed with the patient in detail.  Based on our discussion of treatment options bilateral cortisone injections performed in her knees.  We also discussed the importance of weight loss and how this affects knee pain.  I have also sent in a prescription for meloxicam.  She will continue to use Voltaren gel.  Questions encouraged and answered.  Follow-up as needed.  Follow-Up Instructions: Return if symptoms worsen or fail to improve.   Orders:  No orders of the defined types were placed in this encounter.  Meds ordered this encounter  Medications   meloxicam (MOBIC) 7.5 MG tablet    Sig: Take 1 tablet (7.5 mg total) by mouth 2 (two) times daily as needed for pain.    Dispense:  30 tablet    Refill:  2      Procedures: Large Joint Inj: bilateral knee on 10/06/2019 5:34 PM Indications: pain Details: 22 G needle  Arthrogram: No  Medications (Right): 2 mL lidocaine 1 %; 2 mL bupivacaine 0.5 %; 40 mg methylPREDNISolone acetate 40 MG/ML Medications (Left): 2 mL lidocaine 1 %; 2 mL bupivacaine 0.5 %; 40 mg methylPREDNISolone acetate 40 MG/ML Outcome: tolerated well, no immediate complications Patient was prepped and draped in the usual sterile fashion.       Clinical Data: No additional findings.   Subjective: Chief Complaint  Patient presents with   Left Knee - Pain, New Patient (Initial Visit)   Right Knee - Pain    Cheryl Craig is a very pleasant 54 year old female here for  evaluation of bilateral knee pain.  She had x-rays done about 6 months ago which showed arthritis.  She has had ongoing chronic aching pain that is worse at night and at times it wants to give out when she is using stairs.  She has had previous cortisone injections with temporary relief.  She denies any mechanical symptoms.  She mainly walks for exercise but recently she has not been able to do so because of the pain.  Denies any numbness and tingling.   Review of Systems  Constitutional: Negative.   HENT: Negative.   Eyes: Negative.   Respiratory: Negative.   Cardiovascular: Negative.   Endocrine: Negative.   Musculoskeletal: Negative.   Neurological: Negative.   Hematological: Negative.   Psychiatric/Behavioral: Negative.   All other systems reviewed and are negative.    Objective: Vital Signs: Ht 5\' 4"  (1.626 m)    Wt 226 lb (102.5 kg)    LMP 09/03/2018    BMI 38.79 kg/m   Physical Exam Vitals and nursing note reviewed.  Constitutional:      Appearance: She is well-developed.  HENT:     Head: Normocephalic and atraumatic.  Pulmonary:     Effort: Pulmonary effort is normal.  Abdominal:     Palpations: Abdomen is soft.  Musculoskeletal:  Cervical back: Neck supple.  Skin:    General: Skin is warm.     Capillary Refill: Capillary refill takes less than 2 seconds.  Neurological:     Mental Status: She is alert and oriented to person, place, and time.  Psychiatric:        Behavior: Behavior normal.        Thought Content: Thought content normal.        Judgment: Judgment normal.     Ortho Exam Bilateral knees show trace joint effusions.  Range of motion is overall well-preserved.  Collaterals and cruciates are stable.  No significant pain with range of motion. Specialty Comments:  No specialty comments available.  Imaging: No results found.   PMFS History: Patient Active Problem List   Diagnosis Date Noted   Primary osteoarthritis of right knee 10/06/2019     Primary osteoarthritis of left knee 10/06/2019   Chronic pain of right knee 05/17/2019   Discoloration of skin of lower leg 05/17/2019   Chronic midline low back pain 04/01/2017   GERD (gastroesophageal reflux disease) 04/01/2017   Prediabetes 04/01/2017   Obstructive sleep apnea syndrome, mild 04/01/2017   Plantar fasciitis 04/01/2017   Morbid obesity ([place]) 04/01/2017   S/P laparoscopic sleeve gastrectomy 04/01/2017   Vitamin D deficiency 03/04/2017   Past Medical History:  Diagnosis Date   GERD (gastroesophageal reflux disease)    Obesity    Plantar fasciitis     Family History  Problem Relation Age of Onset   Arthritis Mother    Diabetes Mother    Migraines Father    Stroke Other    Breast cancer Neg Hx     Past Surgical History:  Procedure Laterality Date   CESAREAN SECTION     LAPAROSCOPIC GASTRIC SLEEVE RESECTION N/A 04/01/2017   Procedure: LAPAROSCOPIC GASTRIC SLEEVE RESECTION REPAIR OF HIATAL HERNIA  WITH UPPER ENDOSCOPY;  Surgeon: Greer Pickerel, MD;  Location: [facility];  Service: General;  Laterality: N/A;   Social History   Occupational History   Not on file  Tobacco Use   Smoking status: Never Smoker   Smokeless tobacco: Never Used  Substance and Sexual Activity   Alcohol use: Yes    Alcohol/week: 0.0 standard drinks    Comment: very rare   Drug use: No   Sexual activity: Not on file

## 2019-10-08 ENCOUNTER — Telehealth: Payer: Self-pay | Admitting: Family Medicine

## 2019-10-08 NOTE — Telephone Encounter (Signed)
Patient has questions about her lab results and would like to talk to the nurse.  Please advise

## 2019-10-13 ENCOUNTER — Encounter: Payer: Self-pay | Admitting: Family Medicine

## 2019-10-13 NOTE — Telephone Encounter (Signed)
Spoke with pt state that she got answers to her question answered nothing further needed

## 2019-10-15 ENCOUNTER — Other Ambulatory Visit (INDEPENDENT_AMBULATORY_CARE_PROVIDER_SITE_OTHER): Payer: 59

## 2019-10-15 ENCOUNTER — Encounter: Payer: Self-pay | Admitting: Family Medicine

## 2019-10-15 DIAGNOSIS — Z1329 Encounter for screening for other suspected endocrine disorder: Secondary | ICD-10-CM

## 2019-10-15 LAB — TSH: TSH: 1.49 u[IU]/mL (ref 0.35–4.50)

## 2019-10-15 NOTE — Telephone Encounter (Signed)
Pt calling to see if the msg was received and pt is aware that it has been received and they will get in contact with her.

## 2019-10-19 ENCOUNTER — Ambulatory Visit: Payer: 59 | Attending: Internal Medicine

## 2019-10-19 DIAGNOSIS — Z23 Encounter for immunization: Secondary | ICD-10-CM

## 2019-10-19 DIAGNOSIS — K136 Irritative hyperplasia of oral mucosa: Secondary | ICD-10-CM | POA: Diagnosis not present

## 2019-10-19 NOTE — Progress Notes (Signed)
   Covid-19 Vaccination Clinic  Name:  Cheryl Craig    MRN: [id number] DOB: June 07, 1965  10/19/2019  Cheryl Craig was observed post Covid-19 immunization for 15 minutes without incident. She was provided with Vaccine Information Sheet and instruction to access the V-Safe system.   Cheryl Craig was instructed to call 911 with any severe reactions post vaccine: Marland Kitchen Difficulty breathing  . Swelling of face and throat  . A fast heartbeat  . A bad rash all over body  . Dizziness and weakness   Immunizations Administered    Name Date Dose VIS Date Route   Pfizer COVID-19 Vaccine 10/19/2019  4:05 PM 0.3 mL 06/30/2018 Intramuscular   Manufacturer: [place]   Lot: [id number]   [place]: [id number]

## 2019-10-27 ENCOUNTER — Encounter (HOSPITAL_COMMUNITY): Payer: Self-pay

## 2019-11-02 NOTE — Telephone Encounter (Signed)
Pt was calling back to check on her referral for weight loss management.   I did not see a referral sent-sending message back for order.   Pt can be reached at [phone]

## 2019-11-03 NOTE — Telephone Encounter (Signed)
Called pt to advised that per front office staff at [facility], she does not need a referral to the clinic. Gave pt phone number to contact office. Also, place clinic information in pt Mychart.

## 2019-11-04 ENCOUNTER — Other Ambulatory Visit (HOSPITAL_COMMUNITY)
Admission: RE | Admit: 2019-11-04 | Discharge: 2019-11-04 | Disposition: A | Discharge status: Home / Self Care | Payer: 59 | Source: Ambulatory Visit | Attending: Family Medicine | Admitting: Family Medicine

## 2019-11-04 ENCOUNTER — Other Ambulatory Visit: Payer: Self-pay

## 2019-11-04 ENCOUNTER — Encounter: Payer: Self-pay | Admitting: Family Medicine

## 2019-11-04 ENCOUNTER — Ambulatory Visit (INDEPENDENT_AMBULATORY_CARE_PROVIDER_SITE_OTHER): Payer: 59 | Admitting: Family Medicine

## 2019-11-04 VITALS — BP 98/70 | HR 66 | Temp 97.7°F | Ht 64.0 in | Wt 237.6 lb

## 2019-11-04 DIAGNOSIS — R635 Abnormal weight gain: Secondary | ICD-10-CM | POA: Diagnosis not present

## 2019-11-04 DIAGNOSIS — E559 Vitamin D deficiency, unspecified: Secondary | ICD-10-CM | POA: Diagnosis not present

## 2019-11-04 DIAGNOSIS — Z0000 Encounter for general adult medical examination without abnormal findings: Secondary | ICD-10-CM | POA: Diagnosis not present

## 2019-11-04 DIAGNOSIS — Z124 Encounter for screening for malignant neoplasm of cervix: Secondary | ICD-10-CM

## 2019-11-04 DIAGNOSIS — Z23 Encounter for immunization: Secondary | ICD-10-CM

## 2019-11-04 DIAGNOSIS — Z1211 Encounter for screening for malignant neoplasm of colon: Secondary | ICD-10-CM

## 2019-11-04 LAB — CBC WITH DIFFERENTIAL/PLATELET
Basophils Absolute: 0 10*3/uL (ref 0.0–0.1)
Basophils Relative: 0.3 % (ref 0.0–3.0)
Eosinophils Absolute: 0.2 10*3/uL (ref 0.0–0.7)
Eosinophils Relative: 2.7 % (ref 0.0–5.0)
HCT: 35.6 % — ABNORMAL LOW (ref 36.0–46.0)
Hemoglobin: 11.7 g/dL — ABNORMAL LOW (ref 12.0–15.0)
Lymphocytes Relative: 45.3 % (ref 12.0–46.0)
Lymphs Abs: 2.8 10*3/uL (ref 0.7–4.0)
MCHC: 32.8 g/dL (ref 30.0–36.0)
MCV: 89.3 fl (ref 78.0–100.0)
Monocytes Absolute: 0.4 10*3/uL (ref 0.1–1.0)
Monocytes Relative: 7.3 % (ref 3.0–12.0)
Neutro Abs: 2.7 10*3/uL (ref 1.4–7.7)
Neutrophils Relative %: 44.4 % (ref 43.0–77.0)
Platelets: 314 10*3/uL (ref 150.0–400.0)
RBC: 3.99 Mil/uL (ref 3.87–5.11)
RDW: 14.2 % (ref 11.5–15.5)
WBC: 6.1 10*3/uL (ref 4.0–10.5)

## 2019-11-04 LAB — COMPREHENSIVE METABOLIC PANEL
ALT: 16 U/L (ref 0–35)
AST: 18 U/L (ref 0–37)
Albumin: 4.4 g/dL (ref 3.5–5.2)
Alkaline Phosphatase: 83 U/L (ref 39–117)
BUN: 10 mg/dL (ref 6–23)
CO2: 29 mEq/L (ref 19–32)
Calcium: 9.7 mg/dL (ref 8.4–10.5)
Chloride: 104 mEq/L (ref 96–112)
Creatinine, Ser: 0.89 mg/dL (ref 0.40–1.20)
GFR: 80.64 mL/min (ref >60.00)
Glucose, Bld: 93 mg/dL (ref 70–99)
Potassium: 4.2 mEq/L (ref 3.5–5.1)
Sodium: 140 mEq/L (ref 135–145)
Total Bilirubin: 0.7 mg/dL (ref 0.2–1.2)
Total Protein: 7.6 g/dL (ref 6.0–8.3)

## 2019-11-04 LAB — TSH: TSH: 0.72 u[IU]/mL (ref 0.35–4.50)

## 2019-11-04 LAB — VITAMIN D 25 HYDROXY (VIT D DEFICIENCY, FRACTURES): VITD: 57.65 ng/mL (ref 30.00–100.00)

## 2019-11-04 LAB — HEMOGLOBIN A1C: Hgb A1c MFr Bld: 5.9 % (ref 4.6–6.5)

## 2019-11-04 LAB — T4, FREE: Free T4: 0.88 ng/dL (ref 0.60–1.60)

## 2019-11-04 NOTE — Patient Instructions (Signed)
Preventive Care 40-52 Years Old, Female Preventive care refers to visits with your health care provider and lifestyle choices that can promote health and wellness. This includes:  A yearly physical exam. This may also be called an annual well check.  Regular dental visits and eye exams.  Immunizations.  Screening for certain conditions.  Healthy lifestyle choices, such as eating a healthy diet, getting regular exercise, not using drugs or products that contain nicotine and tobacco, and limiting alcohol use. What can I expect for my preventive care visit? Physical exam Your health care provider will check your:  Height and weight. This may be used to calculate body mass index (BMI), which tells if you are at a healthy weight.  Heart rate and blood pressure.  Skin for abnormal spots. Counseling Your health care provider may ask you questions about your:  Alcohol, tobacco, and drug use.  Emotional well-being.  Home and relationship well-being.  Sexual activity.  Eating habits.  Work and work environment.  Method of birth control.  Menstrual cycle.  Pregnancy history. What immunizations do I need?  Influenza (flu) vaccine  This is recommended every year. Tetanus, diphtheria, and pertussis (Tdap) vaccine  You may need a Td booster every 10 years. Varicella (chickenpox) vaccine  You may need this if you have not been vaccinated. Zoster (shingles) vaccine  You may need this after age 60. Measles, mumps, and rubella (MMR) vaccine  You may need at least one dose of MMR if you were born in 1957 or later. You may also need a second dose. Pneumococcal conjugate (PCV13) vaccine  You may need this if you have certain conditions and were not previously vaccinated. Pneumococcal polysaccharide (PPSV23) vaccine  You may need one or two doses if you smoke cigarettes or if you have certain conditions. Meningococcal conjugate (MenACWY) vaccine  You may need this if you  have certain conditions. Hepatitis A vaccine  You may need this if you have certain conditions or if you travel or work in places where you may be exposed to hepatitis A. Hepatitis B vaccine  You may need this if you have certain conditions or if you travel or work in places where you may be exposed to hepatitis B. Haemophilus influenzae type b (Hib) vaccine  You may need this if you have certain conditions. Human papillomavirus (HPV) vaccine  If recommended by your health care provider, you may need three doses over 6 months. You may receive vaccines as individual doses or as more than one vaccine together in one shot (combination vaccines). Talk with your health care provider about the risks and benefits of combination vaccines. What tests do I need? Blood tests  Lipid and cholesterol levels. These may be checked every 5 years, or more frequently if you are over 50 years old.  Hepatitis C test.  Hepatitis B test. Screening  Lung cancer screening. You may have this screening every year starting at age 55 if you have a 30-pack-year history of smoking and currently smoke or have quit within the past 15 years.  Colorectal cancer screening. All adults should have this screening starting at age 50 and continuing until age 75. Your health care provider may recommend screening at age 45 if you are at increased risk. You will have tests every 1-10 years, depending on your results and the type of screening test.  Diabetes screening. This is done by checking your blood sugar (glucose) after you have not eaten for a while (fasting). You may have this   done every 1-3 years.  Mammogram. This may be done every 1-2 years. Talk with your health care provider about when you should start having regular mammograms. This may depend on whether you have a family history of breast cancer.  BRCA-related cancer screening. This may be done if you have a family history of breast, ovarian, tubal, or peritoneal  cancers.  Pelvic exam and Pap test. This may be done every 3 years starting at age 52. Starting at age 58, this may be done every 5 years if you have a Pap test in combination with an HPV test. Other tests  Sexually transmitted disease (STD) testing.  Bone density scan. This is done to screen for osteoporosis. You may have this scan if you are at high risk for osteoporosis. Follow these instructions at home: Eating and drinking  Eat a diet that includes fresh fruits and vegetables, whole grains, lean protein, and low-fat dairy.  Take vitamin and mineral supplements as recommended by your health care provider.  Do not drink alcohol if: ? Your health care provider tells you not to drink. ? You are pregnant, may be pregnant, or are planning to become pregnant.  If you drink alcohol: ? Limit how much you have to 0-1 drink a day. ? Be aware of how much alcohol is in your drink. In the [place] one drink equals one 12 oz bottle of beer (355 mL), one 5 oz glass of wine (148 mL), or one 1 oz glass of hard liquor (44 mL). Lifestyle  Take daily care of your teeth and gums.  Stay active. Exercise for at least 30 minutes on 5 or more days each week.  Do not use any products that contain nicotine or tobacco, such as cigarettes, e-cigarettes, and chewing tobacco. If you need help quitting, ask your health care provider.  If you are sexually active, practice safe sex. Use a condom or other form of birth control (contraception) in order to prevent pregnancy and STIs (sexually transmitted infections).  If told by your health care provider, take low-dose aspirin daily starting at age 18. What's next?  Visit your health care provider once a year for a well check visit.  Ask your health care provider how often you should have your eyes and teeth checked.  Stay up to date on all vaccines. This information is not intended to replace advice given to you by your health care provider. Make sure you  discuss any questions you have with your health care provider. Document Revised: 01/01/2018 Document Reviewed: 01/01/2018 Elsevier Patient Education  [place].  Preventing Unhealthy [employer], Adult Staying at a healthy weight is important to your overall health. When fat builds up in your body, you may become overweight or obese. Being overweight or obese increases your risk of developing certain health problems, such as heart disease, diabetes, sleeping problems, joint problems, and some types of cancer. Unhealthy weight gain is often the result of making unhealthy food choices or not getting enough exercise. You can make changes to your lifestyle to prevent obesity and stay as healthy as possible. What nutrition changes can be made?   Eat only as much as your body needs. To do this: ? Pay attention to signs that you are hungry or full. Stop eating as soon as you feel full. ? If you feel hungry, try drinking water first before eating. Drink enough water so your urine is clear or pale yellow. ? Eat smaller portions. Pay attention to portion sizes  when eating out. ? Look at serving sizes on food labels. Most foods contain more than one serving per container. ? Eat the recommended number of calories for your gender and activity level. For most active people, a daily total of 2,000 calories is appropriate. If you are trying to lose weight or are not very active, you may need to eat fewer calories. Talk with your health care provider or a diet and nutrition specialist (dietitian) about how many calories you need each day.  Choose healthy foods, such as: ? Fruits and vegetables. At each meal, try to fill at least half of your plate with fruits and vegetables. ? Whole grains, such as whole-wheat bread, brown rice, and quinoa. ? Lean meats, such as chicken or fish. ? Other healthy proteins, such as beans, eggs, or tofu. ? Healthy fats, such as nuts, seeds, fatty fish, and olive  oil. ? Low-fat or fat-free dairy products.  Check food labels, and avoid food and drinks that: ? Are high in calories. ? Have added sugar. ? Are high in sodium. ? Have saturated fats or trans fats.  Cook foods in healthier ways, such as by baking, broiling, or grilling.  Make a meal plan for the week, and shop with a grocery list to help you stay on track with your purchases. Try to avoid going to the grocery store when you are hungry.  When grocery shopping, try to shop around the outside of the store first, where the fresh foods are. Doing this helps you to avoid prepackaged foods, which can be high in sugar, salt (sodium), and fat. What lifestyle changes can be made?   Exercise for 30 or more minutes on 5 or more days each week. Exercising may include brisk walking, yard work, biking, running, swimming, and team sports like basketball and soccer. Ask your health care provider which exercises are safe for you.  Do muscle-strengthening activities, such as lifting weights or using resistance bands, on 2 or more days a week.  Do not use any products that contain nicotine or tobacco, such as cigarettes and e-cigarettes. If you need help quitting, ask your health care provider.  Limit alcohol intake to no more than 1 drink a day for nonpregnant women and 2 drinks a day for men. One drink equals 12 oz of beer, 5 oz of wine, or 1 oz of hard liquor.  Try to get 7-9 hours of sleep each night. What other changes can be made?  Keep a food and activity journal to keep track of: ? What you ate and how many calories you had. Remember to count the calories in sauces, dressings, and side dishes. ? Whether you were active, and what exercises you did. ? Your calorie, weight, and activity goals.  Check your weight regularly. Track any changes. If you notice you have gained weight, make changes to your diet or activity routine.  Avoid taking weight-loss medicines or supplements. Talk to your health  care provider before starting any new medicine or supplement.  Talk to your health care provider before trying any new diet or exercise plan. Why are these changes important? Eating healthy, staying active, and having healthy habits can help you to prevent obesity. Those changes also:  Help you manage stress and emotions.  Help you connect with friends and family.  Improve your self-esteem.  Improve your sleep.  Prevent long-term health problems. What can happen if changes are not made? Being obese or overweight can cause you to develop joint  or bone problems, which can make it hard for you to stay active or do activities you enjoy. Being obese or overweight also puts stress on your heart and lungs and can lead to health problems like diabetes, heart disease, and some cancers. Where to find more information Talk with your health care provider or a dietitian about healthy eating and healthy lifestyle choices. You may also find information from:  U.S. Department of Agriculture, MyPlate: FormerBoss.no  American Heart Association: www.heart.org  Centers for Disease Control and Prevention: http://www.wolf.info/ Summary  Staying at a healthy weight is important to your overall health. It helps you to prevent certain diseases and health problems, such as heart disease, diabetes, joint problems, sleep disorders, and some types of cancer.  Being obese or overweight can cause you to develop joint or bone problems, which can make it hard for you to stay active or do activities you enjoy.  You can prevent unhealthy weight gain by eating a healthy diet, exercising regularly, not smoking, limiting alcohol, and getting enough sleep.  Talk with your health care provider or a dietitian for guidance about healthy eating and healthy lifestyle choices. This information is not intended to replace advice given to you by your health care provider. Make sure you discuss any questions you have with your  health care provider. Document Revised: 04/25/2017 Document Reviewed: 05/29/2016 Elsevier Patient Education  2020 [employer].

## 2019-11-04 NOTE — Progress Notes (Signed)
Subjective:     Cheryl Craig is a 54 y.o. female and is here for a comprehensive physical exam.  Pt has appointment July 16 for biopsy of tongue lesion.  Pt also notes continued discoloration of lower extremities.  Pt has a floor heater near her desk but does not leave it running for extended periods of time.  Pt endorses weight gain.  Trying to work on diet and increasing water intake.  Pt inquires about menopause as LMP June 2021.  Pt has not had mammogram.  Pap due in September but inquires about having it done today.  Mammogram done 09/30/19.  Pt has appointment with rheumatology in the next few months for positive ANA.  Social History   Socioeconomic History  . Marital status: Married    Spouse name: Not on file  . Number of children: Not on file  . Years of education: Not on file  . Highest education level: Not on file  Occupational History  . Not on file  Tobacco Use  . Smoking status: Never Smoker  . Smokeless tobacco: Never Used  Vaping Use  . Vaping Use: Never used  Substance and Sexual Activity  . Alcohol use: Yes    Alcohol/week: 0.0 standard drinks    Comment: very rare  . Drug use: No  . Sexual activity: Not on file  Other Topics Concern  . Not on file  Social History Narrative  . Not on file   Social Determinants of Health   Financial Resource Strain:   . Difficulty of Paying Living Expenses:   Food Insecurity:   . Worried About Charity fundraiser in the Last Year:   . Arboriculturist in the Last Year:   Transportation Needs:   . Film/video editor (Medical):   Marland Kitchen Lack of Transportation (Non-Medical):   Physical Activity:   . Days of Exercise per Week:   . Minutes of Exercise per Session:   Stress:   . Feeling of Stress :   Social Connections:   . Frequency of Communication with Friends and Family:   . Frequency of Social Gatherings with Friends and Family:   . Attends Religious Services:   . Active Member of Clubs or Organizations:   . Attends  Archivist Meetings:   Marland Kitchen Marital Status:   Intimate Partner Violence:   . Fear of Current or Ex-Partner:   . Emotionally Abused:   Marland Kitchen Physically Abused:   . Sexually Abused:    Health Maintenance  Topic Date Due  . Hepatitis C Screening  Never done  . HIV Screening  Never done  . COLONOSCOPY  Never done  . INFLUENZA VACCINE  12/05/2019  . MAMMOGRAM  09/29/2021  . PAP SMEAR-Modifier  11/04/2022  . TETANUS/TDAP  11/03/2029  . COVID-19 Vaccine  Completed    The following portions of the patient's history were reviewed and updated as appropriate: allergies, current medications, past family history, past medical history, past social history, past surgical history and problem list.  Review of Systems Pertinent items noted in HPI and remainder of comprehensive ROS otherwise negative.   Objective:    BP 98/70 (BP Location: Left Arm, Patient Position: Sitting, Cuff Size: Large)   Pulse 66   Temp 97.7 F (36.5 C) (Temporal)   Ht 5\' 4"  (1.626 m)   Wt 237 lb 9.6 oz (107.8 kg)   LMP 11/02/2017 (Approximate)   SpO2 97%   BMI 40.78 kg/m  General appearance: alert, cooperative  and no distress Head: Normocephalic, without obvious abnormality, atraumatic Eyes: conjunctivae/corneas clear. PERRL, EOM's intact. Fundi benign. Ears: normal TM's and external ear canals both ears Nose: Nares normal. Septum midline. Mucosa normal. No drainage or sinus tenderness. Throat: lips, mucosa, and tongue normal; teeth and gums normal Neck: no adenopathy, no carotid bruit, no JVD, supple, symmetrical, trachea midline and thyroid not enlarged, symmetric, no tenderness/mass/nodules Back: symmetric, no curvature. ROM normal. No CVA tenderness. Lungs: clear to auscultation bilaterally Heart: regular rate and rhythm, S1, S2 normal, no murmur, click, rub or gallop Abdomen: soft, non-tender; bowel sounds normal; no masses,  no organomegaly Pelvic: cervix normal in appearance, external genitalia normal,  no adnexal masses or tenderness, no cervical motion tenderness, uterus normal size, shape, and consistency and vagina normal without discharge Extremities: extremities normal, atraumatic, no cyanosis or edema Pulses: 2+ and symmetric Skin: Skin color, texture, turgor normal. No rashes or lesions Lymph nodes: Cervical, supraclavicular, and axillary nodes normal. Neurologic: Alert and oriented X 3, normal strength and tone. Normal symmetric reflexes. Normal coordination and gait    Assessment:    Healthy female exam.      Plan:     Anticipatory guidance given including wearing seatbelts, smoke detectors in the home, increasing physical activity, increasing p.o. intake of water and vegetables. -will obtain labs -pap done this visit -Referral placed for colonoscopy -Tdap given this visit -given handout -next CEP in 1 yr See After Visit Summary for Counseling Recommendations    Cervical cancer screening  - Plan: PAP [Nettie]  Weight gain  -Lifestyle modifications encouraged -Plan: CBC with Differential/Platelet, TSH, T4, Free, Hemoglobin A1c, Comprehensive metabolic panel  Colon cancer screening  - Plan: Ambulatory referral to Gastroenterology  Vitamin D deficiency -Previously on ergocalciferol 50,000 IU weekly. -We will recheck vitamin D level - Plan: Vitamin D, 25-hydroxy  Need for diphtheria-tetanus-pertussis (Tdap) vaccine  - Plan: Tdap vaccine greater than or equal to 7yo IM  F/u prn  Grier Mitts, MD

## 2019-11-05 LAB — CYTOLOGY - PAP
Chlamydia: NEGATIVE
Comment: NEGATIVE
Comment: NEGATIVE
Comment: NEGATIVE
Comment: NORMAL
Diagnosis: NEGATIVE
High risk HPV: NEGATIVE
Neisseria Gonorrhea: NEGATIVE
Trichomonas: NEGATIVE

## 2019-11-09 ENCOUNTER — Ambulatory Visit: Payer: 59 | Attending: Internal Medicine

## 2019-11-09 DIAGNOSIS — Z23 Encounter for immunization: Secondary | ICD-10-CM

## 2019-11-09 NOTE — Progress Notes (Signed)
   Covid-19 Vaccination Clinic  Name:  Cheryl Craig    MRN: [id number] DOB: 1965-07-02  11/09/2019  [name] was observed post Covid-19 immunization for 15 minutes without incident. She was provided with Vaccine Information Sheet and instruction to access the V-Safe system.   [name] was instructed to call 911 with any severe reactions post vaccine: Marland Kitchen Difficulty breathing  . Swelling of face and throat  . A fast heartbeat  . A bad rash all over body  . Dizziness and weakness   Immunizations Administered    Name Date Dose VIS Date Route   Pfizer COVID-19 Vaccine 11/09/2019  2:39 PM 0.3 mL 06/30/2018 Intramuscular   Manufacturer: [place]   Lot: [id number]   [place]: [id number]

## 2019-11-14 ENCOUNTER — Encounter: Payer: Self-pay | Admitting: Family Medicine

## 2019-11-17 ENCOUNTER — Encounter (INDEPENDENT_AMBULATORY_CARE_PROVIDER_SITE_OTHER): Payer: Self-pay

## 2019-11-17 DIAGNOSIS — K136 Irritative hyperplasia of oral mucosa: Secondary | ICD-10-CM | POA: Diagnosis not present

## 2019-11-17 MED FILL — CHLORHEXIDINE 0.12% RINSE: 0.12 | 17 days supply | Qty: 473 | Fill #0

## 2019-11-17 MED FILL — LIDOCAINE 2% VISCOUS SOLN: 2 | 13 days supply | Qty: 200 | Fill #0

## 2019-11-17 MED FILL — HYDROCODON-APAP 5-325: 5-325 | 2 days supply | Qty: 5 | Fill #0

## 2019-11-24 DIAGNOSIS — R7303 Prediabetes: Secondary | ICD-10-CM | POA: Diagnosis not present

## 2019-11-24 DIAGNOSIS — Z9884 Bariatric surgery status: Secondary | ICD-10-CM | POA: Diagnosis not present

## 2019-11-24 DIAGNOSIS — Z6841 Body Mass Index (BMI) 40.0 and over, adult: Secondary | ICD-10-CM | POA: Diagnosis not present

## 2019-11-24 MED FILL — QSYMIA 7.5 MG-46 MG CAPSULE: 7.5-46 | 30 days supply | Qty: 30 | Fill #0

## 2019-12-02 MED FILL — QSYMIA 3.75 MG-23 MG CAP: 3.75-23 | 14 days supply | Qty: 14 | Fill #0

## 2019-12-22 ENCOUNTER — Other Ambulatory Visit: Payer: 59

## 2019-12-22 ENCOUNTER — Other Ambulatory Visit: Payer: Self-pay | Admitting: Critical Care Medicine

## 2019-12-22 DIAGNOSIS — Z20822 Contact with and (suspected) exposure to covid-19: Secondary | ICD-10-CM | POA: Diagnosis not present

## 2019-12-23 LAB — SARS-COV-2, NAA 2 DAY TAT

## 2019-12-23 LAB — NOVEL CORONAVIRUS, NAA: SARS-CoV-2, NAA: NOT DETECTED

## 2019-12-31 MED FILL — QSYMIA 7.5 MG-46 MG CAPSULE: 7.5-46 | 30 days supply | Qty: 30 | Fill #1

## 2020-01-05 DIAGNOSIS — Z6838 Body mass index (BMI) 38.0-38.9, adult: Secondary | ICD-10-CM | POA: Diagnosis not present

## 2020-01-05 DIAGNOSIS — Z9884 Bariatric surgery status: Secondary | ICD-10-CM | POA: Diagnosis not present

## 2020-01-05 DIAGNOSIS — E669 Obesity, unspecified: Secondary | ICD-10-CM | POA: Diagnosis not present

## 2020-01-05 DIAGNOSIS — R7303 Prediabetes: Secondary | ICD-10-CM | POA: Diagnosis not present

## 2020-01-24 NOTE — Progress Notes (Signed)
Office Visit Note  Patient: Cheryl Craig             Date of Birth: [birth year]           MRN: [id number]             PCP: Billie Ruddy, MD Referring: Billie Ruddy, MD Visit Date: 02/07/2020 Occupation: @GUAROCC @  Subjective:  Positive ANA and bilateral knee joint pain.   History of Present Illness: Cheryl Craig is a 54 y.o. female seen in consultation per request of her PCP.  According to the patient about 10 years ago she started having pain in her left knee.  She states initially she has a stiffness then gradually she started feeling some swelling in her left knee.  The pain was tolerable initially and will move from one knee to the other knee.  She states gradually the pain became more persistent and was in bilateral knee joints.  The pain would last for 1 month and then will go away.  She started having pain in her calves as well.  She was seen at the urgent care to rule out DVT and then referred to emergency room last year.  She states she had x-rays and also MRI and was sent to [facility] where she had another set of x-rays and MRI and was told that she has severe osteoarthritis and was given cortisone injections.  She states she was recently seen by Dr. Erlinda Hong who also diagnosed her with osteoarthritis and gave her some injections in June 2021.  She states the effect lasted until now but now the symptoms are coming back.  None of the other joints are painful.  She states her mother had osteoarthritis and her father had gout.  She denies any history of oral ulcers, nasal ulcers, malar rash, photosensitivity, Raynaud's phenomenon, lymphadenopathy.  She is gravida 1, para 2, miscarriages 0.  No history of blood clots.  Activities of Daily Living:  Patient reports morning stiffness for 45 minutes.   Patient Denies nocturnal pain.  Difficulty dressing/grooming: Denies Difficulty climbing stairs: Reports Difficulty getting out of chair: Denies Difficulty using hands for taps,  buttons, cutlery, and/or writing: Denies  Review of Systems  Constitutional: Negative for fatigue, night sweats, weight gain and weight loss.  HENT: Negative for mouth sores, trouble swallowing, trouble swallowing, mouth dryness and nose dryness.   Eyes: Negative for pain, redness, itching, visual disturbance and dryness.  Respiratory: Negative for cough, shortness of breath, wheezing and difficulty breathing.   Cardiovascular: Negative for chest pain, palpitations, hypertension, irregular heartbeat and swelling in legs/feet.  Gastrointestinal: Negative for abdominal pain, blood in stool, constipation and diarrhea.  Endocrine: Negative for increased urination.  Genitourinary: Negative for difficulty urinating, painful urination and vaginal dryness.  Musculoskeletal: Positive for arthralgias, joint pain, joint swelling and morning stiffness. Negative for myalgias, muscle weakness, muscle tenderness and myalgias.  Skin: Negative for color change, rash, hair loss, redness, skin tightness, ulcers and sensitivity to sunlight.  Allergic/Immunologic: Negative for susceptible to infections.  Neurological: Negative for dizziness, numbness, headaches, memory loss, night sweats and weakness.  Hematological: Negative for bruising/bleeding tendency and swollen glands.  Psychiatric/Behavioral: Negative for depressed mood, confusion and sleep disturbance. The patient is not nervous/anxious.     PMFS History:  Patient Active Problem List   Diagnosis Date Noted  . Primary osteoarthritis of right knee 10/06/2019  . Primary osteoarthritis of left knee 10/06/2019  . Chronic pain of right knee 05/17/2019  . Discoloration  of skin of lower leg 05/17/2019  . Chronic midline low back pain 04/01/2017  . GERD (gastroesophageal reflux disease) 04/01/2017  . Prediabetes 04/01/2017  . Obstructive sleep apnea syndrome, mild 04/01/2017  . Plantar fasciitis 04/01/2017  . Morbid obesity ([place]) 04/01/2017  . S/P  laparoscopic sleeve gastrectomy 04/01/2017  . Vitamin D deficiency 03/04/2017    Past Medical History:  Diagnosis Date  . GERD (gastroesophageal reflux disease)   . Obesity   . Plantar fasciitis     Family History  Problem Relation Age of Onset  . Arthritis Mother   . Alzheimer's disease Father   . Healthy Son   . Healthy Son   . Breast cancer Neg Hx    Past Surgical History:  Procedure Laterality Date  . CESAREAN SECTION    . LAPAROSCOPIC GASTRIC SLEEVE RESECTION N/A 04/01/2017   Procedure: LAPAROSCOPIC GASTRIC SLEEVE RESECTION REPAIR OF HIATAL HERNIA  WITH UPPER ENDOSCOPY;  Surgeon: Greer Pickerel, MD;  Location: [facility];  Service: General;  Laterality: N/A;   Social History   Social History Narrative  . Not on file   Immunization History  Administered Date(s) Administered  . Influenza-Unspecified 02/28/2015  . PFIZER SARS-COV-2 Vaccination 10/19/2019, 11/09/2019  . Td 03/06/2000  . Tdap 11/04/2019     Objective: Vital Signs: BP 98/64 (BP Location: Right Arm, Patient Position: Sitting, Cuff Size: Large)   Pulse (!) 54   Resp 15   Ht 5\' 4"  (1.626 m)   Wt 220 lb 3.2 oz (99.9 kg)   LMP 09/03/2018   BMI 37.80 kg/m    Physical Exam Vitals and nursing note reviewed.  Constitutional:      Appearance: She is well-developed.  HENT:     Head: Normocephalic and atraumatic.  Eyes:     Conjunctiva/sclera: Conjunctivae normal.  Cardiovascular:     Rate and Rhythm: Normal rate and regular rhythm.     Heart sounds: Normal heart sounds.  Pulmonary:     Effort: Pulmonary effort is normal.     Breath sounds: Normal breath sounds.  Abdominal:     General: Bowel sounds are normal.     Palpations: Abdomen is soft.  Musculoskeletal:     Cervical back: Normal range of motion.  Lymphadenopathy:     Cervical: No cervical adenopathy.  Skin:    General: Skin is warm and dry.     Capillary Refill: Capillary refill takes less than 2 seconds.  Neurological:     Mental Status:  She is alert and oriented to person, place, and time.  Psychiatric:        Behavior: Behavior normal.      Musculoskeletal Exam: C-spine thoracic and lumbar spine were in good range of motion.  Shoulder joints, elbow joints, wrist joints, MCPs PIPs and DIPs with good range of motion.  Hip joints with good range of motion.  She has some warmth on palpation of her knee joints but no swelling or effusion was noted.  She has some tightness in her left knee joint on flexing.  No ankle joint, MTP or PIP discomfort was noted.  CDAI Exam: CDAI Score: -- Patient Global: --; Provider Global: -- Swollen: --; Tender: -- Joint Exam 02/07/2020   No joint exam has been documented for this visit   There is currently no information documented on the homunculus. Go to the Rheumatology activity and complete the homunculus joint exam.  Investigation: No additional findings.  Imaging: XR KNEE 3 VIEW LEFT  Result Date: 02/07/2020 Moderate medial  compartment narrowing with medial and intercondylar osteophytes was noted.  No chondrocalcinosis was noted.  Moderate patellofemoral narrowing was noted. Impression: These findings are consistent with moderate osteoarthritis and moderate chondromalacia patella.   Recent Labs: Lab Results  Component Value Date   WBC 6.1 11/04/2019   HGB 11.7 (L) 11/04/2019   PLT 314.0 11/04/2019   NA 140 11/04/2019   K 4.2 11/04/2019   CL 104 11/04/2019   CO2 29 11/04/2019   GLUCOSE 93 11/04/2019   BUN 10 11/04/2019   CREATININE 0.89 11/04/2019   BILITOT 0.7 11/04/2019   ALKPHOS 83 11/04/2019   AST 18 11/04/2019   ALT 16 11/04/2019   PROT 7.6 11/04/2019   ALBUMIN 4.4 11/04/2019   CALCIUM 9.7 11/04/2019   GFRAA >60 04/02/2017  April 06, 2019 x-ray of the right knee joint was reviewed which showed mild medial compartment narrowing consistent with osteoarthritis and mild chondromalacia patella.  November 02, 2016 MRI right knee joint IMPRESSION: 1. Partial thickness  radial tear involving the posterior horn of the medial meniscus with slight medial protrusion. 2. Intact ligamentous structures and no acute bony findings. 3. Tricompartmental degenerative changes most significant in the medial compartment. 4. Large joint effusion and mild to moderate synovitis.   Electronically Signed   By: Marijo Sanes M.D.   On: 11/02/2016 13:52  Speciality Comments: No specialty comments available.  Procedures:  No procedures performed Allergies: Patient has no known allergies.   Assessment / Plan:     Visit Diagnoses: Positive ANA (antinuclear antibody) - 09/24/19: ANA 1:80NS, C3 162, C4 42, CRP 1.1, RF<146/11/21: TSH 1.49 -she has low titer ANA and does not have any clinical features of lupus.  I will obtain additional antibodies today.  Plan: RNP Antibody, Anti-Smith antibody, Sjogrens syndrome-A extractable nuclear antibody, Sjogrens syndrome-B extractable nuclear antibody, Anti-DNA antibody, double-stranded, Anti-scleroderma antibody  Chronic pain of both knees - Plan: XR KNEE 3 VIEW LEFT, the x-ray was consistent with moderate osteoarthritis and moderate chondromalacia patella.  X-ray findings were discussed with the patient.  Chronic inflammatory arthritis-patient comes in because of pain and inflammation in her bilateral knee joints for the last few years.  Previous MRI showed large effusion.  She states she has not taken meloxicam which was prescribed to her in the past as she was hesitant to take anti-inflammatories.  She was given meloxicam by Dr.Xu recently which helped her but she does not want to be on long-term meds.  She also had cortisone injection to her both knee joints with Dr. Mirian Mo relieved her symptoms until recently.  I will obtain following labs to look for any underlying autoimmune process.  Sedimentation rate, Angiotensin converting enzyme, Cyclic citrul peptide antibody, IgG, 14-3-3 eta Protein, Uric acid  Primary osteoarthritis of both  knees-I also reviewed her previous x-ray of the right knee joint which was consistent with moderate osteoarthritis and moderate chondromalacia patella.  She gives history of intermittent inflammation in her knee joints.  The MRI of the right knee joint from 2018 showed large effusion.  I will obtain autoimmune labs today.  I have also given her a handout on knee exercises and list of natural anti-inflammatories.  Plantar fasciitis-no recent episodes.  Vitamin D deficiency-she is on vitamin D supplement.  S/P laparoscopic sleeve gastrectomy  History of gastroesophageal reflux (GERD)  Prediabetes  Obstructive sleep apnea syndrome, mild  Orders: Orders Placed This Encounter  Procedures  . XR KNEE 3 VIEW LEFT  . Sedimentation rate  . Angiotensin converting enzyme  .  Cyclic citrul peptide antibody, IgG  . 14-3-3 eta Protein  . RNP Antibody  . Anti-Smith antibody  . Sjogrens syndrome-A extractable nuclear antibody  . Sjogrens syndrome-B extractable nuclear antibody  . Anti-DNA antibody, double-stranded  . Anti-scleroderma antibody  . Uric acid   No orders of the defined types were placed in this encounter.    Follow-Up Instructions: Return for Osteoarthritis.   Bo Merino, MD  Note - This record has been created using Editor, commissioning.  Chart creation errors have been sought, but may not always  have been located. Such creation errors do not reflect on  the standard of medical care.

## 2020-01-26 DIAGNOSIS — Z1211 Encounter for screening for malignant neoplasm of colon: Secondary | ICD-10-CM | POA: Diagnosis not present

## 2020-01-26 DIAGNOSIS — E6609 Other obesity due to excess calories: Secondary | ICD-10-CM | POA: Diagnosis not present

## 2020-01-26 MED FILL — SUPREP BOWEL PREP KIT: 17.5-3.13-1 | 2 days supply | Qty: 354 | Fill #0

## 2020-01-28 MED FILL — QSYMIA 7.5 MG-46 MG CAPSULE: 7.5-46 | 30 days supply | Qty: 30 | Fill #0

## 2020-02-07 ENCOUNTER — Encounter: Payer: Self-pay | Admitting: Rheumatology

## 2020-02-07 ENCOUNTER — Other Ambulatory Visit: Payer: Self-pay

## 2020-02-07 ENCOUNTER — Ambulatory Visit: Payer: Self-pay

## 2020-02-07 ENCOUNTER — Ambulatory Visit (INDEPENDENT_AMBULATORY_CARE_PROVIDER_SITE_OTHER): Payer: 59 | Admitting: Rheumatology

## 2020-02-07 VITALS — BP 98/64 | HR 54 | Resp 15 | Ht 64.0 in | Wt 220.2 lb

## 2020-02-07 DIAGNOSIS — E559 Vitamin D deficiency, unspecified: Secondary | ICD-10-CM | POA: Diagnosis not present

## 2020-02-07 DIAGNOSIS — G8929 Other chronic pain: Secondary | ICD-10-CM | POA: Diagnosis not present

## 2020-02-07 DIAGNOSIS — M25561 Pain in right knee: Secondary | ICD-10-CM

## 2020-02-07 DIAGNOSIS — M722 Plantar fascial fibromatosis: Secondary | ICD-10-CM

## 2020-02-07 DIAGNOSIS — M25562 Pain in left knee: Secondary | ICD-10-CM

## 2020-02-07 DIAGNOSIS — M170 Bilateral primary osteoarthritis of knee: Secondary | ICD-10-CM | POA: Diagnosis not present

## 2020-02-07 DIAGNOSIS — Z9884 Bariatric surgery status: Secondary | ICD-10-CM

## 2020-02-07 DIAGNOSIS — R768 Other specified abnormal immunological findings in serum: Secondary | ICD-10-CM | POA: Diagnosis not present

## 2020-02-07 DIAGNOSIS — G4733 Obstructive sleep apnea (adult) (pediatric): Secondary | ICD-10-CM

## 2020-02-07 DIAGNOSIS — Z8719 Personal history of other diseases of the digestive system: Secondary | ICD-10-CM | POA: Diagnosis not present

## 2020-02-07 DIAGNOSIS — M1990 Unspecified osteoarthritis, unspecified site: Secondary | ICD-10-CM

## 2020-02-07 DIAGNOSIS — R7303 Prediabetes: Secondary | ICD-10-CM

## 2020-02-07 NOTE — Patient Instructions (Signed)
Journal for Nurse Practitioners, 15(4), 263-267. Retrieved February 09, 2018 from [email or web address].com/[email or web address]">  Knee Exercises Ask your health care provider which exercises are safe for you. Do exercises exactly as told by your health care provider and adjust them as directed. It is normal to feel mild stretching, pulling, tightness, or discomfort as you do these exercises. Stop right away if you feel sudden pain or your pain gets worse. Do not begin these exercises until told by your health care provider. Stretching and range-of-motion exercises These exercises warm up your muscles and joints and improve the movement and flexibility of your knee. These exercises also help to relieve pain and swelling. Knee extension, prone 1. Lie on your abdomen (prone position) on a bed. 2. Place your left / right knee just beyond the edge of the surface so your knee is not on the bed. You can put a towel under your left / right thigh just above your kneecap for comfort. 3. Relax your leg muscles and allow gravity to straighten your knee (extension). You should feel a stretch behind your left / right knee. 4. Hold this position for __________ seconds. 5. Scoot up so your knee is supported between repetitions. Repeat __________ times. Complete this exercise __________ times a day. Knee flexion, active  1. Lie on your back with both legs straight. If this causes back discomfort, bend your left / right knee so your foot is flat on the floor. 2. Slowly slide your left / right heel back toward your buttocks. Stop when you feel a gentle stretch in the front of your knee or thigh (flexion). 3. Hold this position for __________ seconds. 4. Slowly slide your left / right heel back to the starting position. Repeat __________ times. Complete this exercise __________ times a day. Quadriceps stretch, prone  1. Lie on your abdomen on a firm surface, such as a bed or padded floor. 2. Bend your left / right knee and hold  your ankle. If you cannot reach your ankle or pant leg, loop a belt around your foot and grab the belt instead. 3. Gently pull your heel toward your buttocks. Your knee should not slide out to the side. You should feel a stretch in the front of your thigh and knee (quadriceps). 4. Hold this position for __________ seconds. Repeat __________ times. Complete this exercise __________ times a day. Hamstring, supine 1. Lie on your back (supine position). 2. Loop a belt or towel over the ball of your left / right foot. The ball of your foot is on the walking surface, right under your toes. 3. Straighten your left / right knee and slowly pull on the belt to raise your leg until you feel a gentle stretch behind your knee (hamstring). ? Do not let your knee bend while you do this. ? Keep your other leg flat on the floor. 4. Hold this position for __________ seconds. Repeat __________ times. Complete this exercise __________ times a day. Strengthening exercises These exercises build strength and endurance in your knee. Endurance is the ability to use your muscles for a long time, even after they get tired. Quadriceps, isometric This exercise stretches the muscles in front of your thigh (quadriceps) without moving your knee joint (isometric). 1. Lie on your back with your left / right leg extended and your other knee bent. Put a rolled towel or small pillow under your knee if told by your health care provider. 2. Slowly tense the muscles in the front of your left /   right thigh. You should see your kneecap slide up toward your hip or see increased dimpling just above the knee. This motion will push the back of the knee toward the floor. 3. For __________ seconds, hold the muscle as tight as you can without increasing your pain. 4. Relax the muscles slowly and completely. Repeat __________ times. Complete this exercise __________ times a day. Straight leg raises This exercise stretches the muscles in front  of your thigh (quadriceps) and the muscles that move your hips (hip flexors). 1. Lie on your back with your left / right leg extended and your other knee bent. 2. Tense the muscles in the front of your left / right thigh. You should see your kneecap slide up or see increased dimpling just above the knee. Your thigh may even shake a bit. 3. Keep these muscles tight as you raise your leg 4-6 inches (10-15 cm) off the floor. Do not let your knee bend. 4. Hold this position for __________ seconds. 5. Keep these muscles tense as you lower your leg. 6. Relax your muscles slowly and completely after each repetition. Repeat __________ times. Complete this exercise __________ times a day. Hamstring, isometric 1. Lie on your back on a firm surface. 2. Bend your left / right knee about __________ degrees. 3. Dig your left / right heel into the surface as if you are trying to pull it toward your buttocks. Tighten the muscles in the back of your thighs (hamstring) to "dig" as hard as you can without increasing any pain. 4. Hold this position for __________ seconds. 5. Release the tension gradually and allow your muscles to relax completely for __________ seconds after each repetition. Repeat __________ times. Complete this exercise __________ times a day. Hamstring curls If told by your health care provider, do this exercise while wearing ankle weights. Begin with __________ lb weights. Then increase the weight by 1 lb (0.5 kg) increments. Do not wear ankle weights that are more than __________ lb. 1. Lie on your abdomen with your legs straight. 2. Bend your left / right knee as far as you can without feeling pain. Keep your hips flat against the floor. 3. Hold this position for __________ seconds. 4. Slowly lower your leg to the starting position. Repeat __________ times. Complete this exercise __________ times a day. Squats This exercise strengthens the muscles in front of your thigh and knee  (quadriceps). 1. Stand in front of a table, with your feet and knees pointing straight ahead. You may rest your hands on the table for balance but not for support. 2. Slowly bend your knees and lower your hips like you are going to sit in a chair. ? Keep your weight over your heels, not over your toes. ? Keep your lower legs upright so they are parallel with the table legs. ? Do not let your hips go lower than your knees. ? Do not bend lower than told by your health care provider. ? If your knee pain increases, do not bend as low. 3. Hold the squat position for __________ seconds. 4. Slowly push with your legs to return to standing. Do not use your hands to pull yourself to standing. Repeat __________ times. Complete this exercise __________ times a day. Wall slides This exercise strengthens the muscles in front of your thigh and knee (quadriceps). 1. Lean your back against a smooth wall or door, and walk your feet out 18-24 inches (46-61 cm) from it. 2. Place your feet hip-width apart. 3.   Slowly slide down the wall or door until your knees bend __________ degrees. Keep your knees over your heels, not over your toes. Keep your knees in line with your hips. 4. Hold this position for __________ seconds. Repeat __________ times. Complete this exercise __________ times a day. Straight leg raises This exercise strengthens the muscles that rotate the leg at the hip and move it away from your body (hip abductors). 1. Lie on your side with your left / right leg in the top position. Lie so your head, shoulder, knee, and hip line up. You may bend your bottom knee to help you keep your balance. 2. Roll your hips slightly forward so your hips are stacked directly over each other and your left / right knee is facing forward. 3. Leading with your heel, lift your top leg 4-6 inches (10-15 cm). You should feel the muscles in your outer hip lifting. ? Do not let your foot drift forward. ? Do not let your knee  roll toward the ceiling. 4. Hold this position for __________ seconds. 5. Slowly return your leg to the starting position. 6. Let your muscles relax completely after each repetition. Repeat __________ times. Complete this exercise __________ times a day. Straight leg raises This exercise stretches the muscles that move your hips away from the front of the pelvis (hip extensors). 1. Lie on your abdomen on a firm surface. You can put a pillow under your hips if that is more comfortable. 2. Tense the muscles in your buttocks and lift your left / right leg about 4-6 inches (10-15 cm). Keep your knee straight as you lift your leg. 3. Hold this position for __________ seconds. 4. Slowly lower your leg to the starting position. 5. Let your leg relax completely after each repetition. Repeat __________ times. Complete this exercise __________ times a day. This information is not intended to replace advice given to you by your health care provider. Make sure you discuss any questions you have with your health care provider. Document Revised: 02/10/2018 Document Reviewed: 02/10/2018 Elsevier Patient Education  2020 [employer]  

## 2020-02-16 DIAGNOSIS — R768 Other specified abnormal immunological findings in serum: Secondary | ICD-10-CM | POA: Diagnosis not present

## 2020-02-16 DIAGNOSIS — M25562 Pain in left knee: Secondary | ICD-10-CM | POA: Diagnosis not present

## 2020-02-16 DIAGNOSIS — M25561 Pain in right knee: Secondary | ICD-10-CM | POA: Diagnosis not present

## 2020-02-16 DIAGNOSIS — G8929 Other chronic pain: Secondary | ICD-10-CM | POA: Diagnosis not present

## 2020-02-21 NOTE — Progress Notes (Signed)
Office Visit Note  Patient: Cheryl Craig             Date of Birth: 1965/08/08           MRN: [id number]             PCP: Billie Ruddy, MD Referring: Billie Ruddy, MD Visit Date: 03/01/2020 Occupation: '@GUAROCC' @  Subjective:  Pain in both knees.   History of Present Illness: Cheryl Craig is a 54 y.o. female with history of osteoarthritis.  She states that she has intermittent swelling and stiffness in her knee joints.  None of the other joints are painful.  She has not had any recent episode of plantar fasciitis.  She denies any history of oral ulcers, nasal ulcers, malar rash, photosensitivity, Raynaud's phenomenon.  She has noticed some darker spots on her lower extremities.  She is concerned about it.  Activities of Daily Living:  Patient reports morning stiffness for 20-30 minutes.   Patient Reports nocturnal pain.  Difficulty dressing/grooming: Denies Difficulty climbing stairs: Reports Difficulty getting out of chair: Denies Difficulty using hands for taps, buttons, cutlery, and/or writing: Denies  Review of Systems  Constitutional: Negative for fatigue.  HENT: Negative for mouth sores, mouth dryness and nose dryness.   Eyes: Negative for pain, itching, visual disturbance and dryness.  Respiratory: Negative for cough, hemoptysis, shortness of breath and difficulty breathing.   Cardiovascular: Negative for chest pain, palpitations and swelling in legs/feet.  Gastrointestinal: Negative for abdominal pain, blood in stool, constipation and diarrhea.  Endocrine: Negative for increased urination.  Genitourinary: Negative for painful urination.  Musculoskeletal: Positive for arthralgias, joint pain, joint swelling and morning stiffness. Negative for myalgias, muscle weakness, muscle tenderness and myalgias.  Skin: Positive for color change. Negative for rash and redness.       Patient complains of discoloration patches on bilateral legs.  Allergic/Immunologic:  Negative for susceptible to infections.  Neurological: Negative for dizziness, numbness, headaches, memory loss and weakness.  Hematological: Negative for swollen glands.  Psychiatric/Behavioral: Negative for confusion and sleep disturbance.    PMFS History:  Patient Active Problem List   Diagnosis Date Noted  . Primary osteoarthritis of right knee 10/06/2019  . Primary osteoarthritis of left knee 10/06/2019  . Chronic pain of right knee 05/17/2019  . Discoloration of skin of lower leg 05/17/2019  . Chronic midline low back pain 04/01/2017  . GERD (gastroesophageal reflux disease) 04/01/2017  . Prediabetes 04/01/2017  . Obstructive sleep apnea syndrome, mild 04/01/2017  . Plantar fasciitis 04/01/2017  . Morbid obesity ([place]) 04/01/2017  . S/P laparoscopic sleeve gastrectomy 04/01/2017  . Vitamin D deficiency 03/04/2017    Past Medical History:  Diagnosis Date  . GERD (gastroesophageal reflux disease)   . Obesity   . Plantar fasciitis     Family History  Problem Relation Age of Onset  . Arthritis Mother   . Alzheimer's disease Father   . Healthy Son   . Healthy Son   . Breast cancer Neg Hx    Past Surgical History:  Procedure Laterality Date  . CESAREAN SECTION    . LAPAROSCOPIC GASTRIC SLEEVE RESECTION N/A 04/01/2017   Procedure: LAPAROSCOPIC GASTRIC SLEEVE RESECTION REPAIR OF HIATAL HERNIA  WITH UPPER ENDOSCOPY;  Surgeon: Greer Pickerel, MD;  Location: [facility];  Service: General;  Laterality: N/A;   Social History   Social History Narrative  . Not on file   Immunization History  Administered Date(s) Administered  . Influenza-Unspecified 02/28/2015  . PFIZER  SARS-COV-2 Vaccination 10/19/2019, 11/09/2019  . Td 03/06/2000  . Tdap 11/04/2019     Objective: Vital Signs: BP 140/70 (BP Location: Left Arm, Patient Position: Sitting, Cuff Size: Small)   Pulse 66   Ht '5\' 4"'  (1.626 m)   Wt 219 lb 12.8 oz (99.7 kg)   LMP 09/03/2018   BMI 37.73 kg/m    Physical  Exam Vitals and nursing note reviewed.  Constitutional:      Appearance: She is well-developed.  HENT:     Head: Normocephalic and atraumatic.  Eyes:     Conjunctiva/sclera: Conjunctivae normal.  Cardiovascular:     Rate and Rhythm: Normal rate and regular rhythm.     Heart sounds: Normal heart sounds.  Pulmonary:     Effort: Pulmonary effort is normal.     Breath sounds: Normal breath sounds.  Abdominal:     General: Bowel sounds are normal.     Palpations: Abdomen is soft.  Musculoskeletal:     Cervical back: Normal range of motion.  Lymphadenopathy:     Cervical: No cervical adenopathy.  Skin:    General: Skin is warm and dry.     Capillary Refill: Capillary refill takes less than 2 seconds.     Comments: Hyperpigmented patches were noted on bilateral lower extremities.  Neurological:     Mental Status: She is alert and oriented to person, place, and time.  Psychiatric:        Behavior: Behavior normal.      Musculoskeletal Exam: C-spine, thoracic and lumbar spine with good range of motion.  Shoulder joints, elbow joints, wrist joints, MCPs PIPs and DIPs with good range of motion with no synovitis.  Hip joints, knee joints, ankles, MTPs and PIPs with good range of motion with no synovitis.  There was no warmth swelling or effusion in her knee joints.  CDAI Exam: CDAI Score: -- Patient Global: --; Provider Global: -- Swollen: --; Tender: -- Joint Exam 03/01/2020   No joint exam has been documented for this visit   There is currently no information documented on the homunculus. Go to the Rheumatology activity and complete the homunculus joint exam.  Investigation: No additional findings.  Imaging: XR KNEE 3 VIEW LEFT  Result Date: 02/07/2020 Moderate medial compartment narrowing with medial and intercondylar osteophytes was noted.  No chondrocalcinosis was noted.  Moderate patellofemoral narrowing was noted. Impression: These findings are consistent with moderate  osteoarthritis and moderate chondromalacia patella.   Recent Labs: Lab Results  Component Value Date   WBC 6.1 11/04/2019   HGB 11.7 (L) 11/04/2019   PLT 314.0 11/04/2019   NA 140 11/04/2019   K 4.2 11/04/2019   CL 104 11/04/2019   CO2 29 11/04/2019   GLUCOSE 93 11/04/2019   BUN 10 11/04/2019   CREATININE 0.89 11/04/2019   BILITOT 0.7 11/04/2019   ALKPHOS 83 11/04/2019   AST 18 11/04/2019   ALT 16 11/04/2019   PROT 7.6 11/04/2019   ALBUMIN 4.4 11/04/2019   CALCIUM 9.7 11/04/2019   GFRAA >60 04/02/2017   February 16, 2020 ESR 11, ACE 15, ENA negative, anti-CCP negative, uric acid 4.1   09/24/19: ANA 1:80NS, C3 162, C4 42, CRP 1.1, RF<14 10/15/19: TSH 1.49  Speciality Comments: No specialty comments available.  Procedures:  No procedures performed Allergies: Patient has no known allergies.   Assessment / Plan:     Visit Diagnoses: Chronic inflammatory arthritis - History of bilateral knee joint pain and swelling.  MRI of right knee showed  large effusion 2018.  Treated by Dr. Erlinda Hong with Mobic.  No synovitis was noted on the examination today.  Positive ANA (antinuclear antibody) - ANA is low titer positive.  All of other autoimmune work-up was negative.  Left findings were discussed at length.  Primary osteoarthritis of both knees - Moderate osteoarthritis and moderate chondromalacia patella.  Weight loss diet and exercise was emphasized.  Plantar fasciitis - In the past, with no recurrence.  Hyperpigmentation of skin-I noticed hyperpigmentation on her bilateral lower extremities.  Have advised her to schedule an appointment with a dermatologist.  S/P laparoscopic sleeve gastrectomy  History of gastroesophageal reflux (GERD)  Prediabetes  Vitamin D deficiency  Obstructive sleep apnea syndrome, mild  Orders: No orders of the defined types were placed in this encounter.  No orders of the defined types were placed in this encounter.    Follow-Up Instructions:  Return in about 6 months (around 08/30/2020) for Osteoarthritis.   Bo Merino, MD  Note - This record has been created using Editor, commissioning.  Chart creation errors have been sought, but may not always  have been located. Such creation errors do not reflect on  the standard of medical care.

## 2020-02-24 DIAGNOSIS — Z1211 Encounter for screening for malignant neoplasm of colon: Secondary | ICD-10-CM | POA: Diagnosis not present

## 2020-02-28 LAB — SEDIMENTATION RATE: Sed Rate: 11 mm/h (ref 0–30)

## 2020-02-28 LAB — ANTI-DNA ANTIBODY, DOUBLE-STRANDED: ds DNA Ab: <1 IU/mL

## 2020-02-28 LAB — URIC ACID: Uric Acid, Serum: 4.1 mg/dL (ref 2.5–7.0)

## 2020-02-28 LAB — ANGIOTENSIN CONVERTING ENZYME: Angiotensin-Converting Enzyme: 15 U/L (ref 9–67)

## 2020-02-28 LAB — SJOGRENS SYNDROME-A EXTRACTABLE NUCLEAR ANTIBODY: SSA (Ro) (ENA) Antibody, IgG: <1 AI

## 2020-02-28 LAB — ANTI-SMITH ANTIBODY: ENA SM Ab Ser-aCnc: <1 AI

## 2020-02-28 LAB — 14-3-3 ETA PROTEIN: 14-3-3 eta Protein: <0.2 ng/mL (ref <0.2)

## 2020-02-28 LAB — CYCLIC CITRUL PEPTIDE ANTIBODY, IGG: Cyclic Citrullin Peptide Ab: <16 UNITS

## 2020-02-28 LAB — RNP ANTIBODY: Ribonucleic Protein(ENA) Antibody, IgG: <1 AI

## 2020-02-28 LAB — SJOGRENS SYNDROME-B EXTRACTABLE NUCLEAR ANTIBODY: SSB (La) (ENA) Antibody, IgG: <1 AI

## 2020-02-28 LAB — ANTI-SCLERODERMA ANTIBODY: Scleroderma (Scl-70) (ENA) Antibody, IgG: <1 AI

## 2020-02-29 NOTE — Progress Notes (Signed)
I will discuss results at the follow-up visit.

## 2020-03-01 ENCOUNTER — Ambulatory Visit (INDEPENDENT_AMBULATORY_CARE_PROVIDER_SITE_OTHER): Payer: 59 | Admitting: Rheumatology

## 2020-03-01 ENCOUNTER — Encounter: Payer: Self-pay | Admitting: Rheumatology

## 2020-03-01 ENCOUNTER — Other Ambulatory Visit: Payer: Self-pay

## 2020-03-01 VITALS — BP 140/70 | HR 66 | Ht 64.0 in | Wt 219.8 lb

## 2020-03-01 DIAGNOSIS — L819 Disorder of pigmentation, unspecified: Secondary | ICD-10-CM

## 2020-03-01 DIAGNOSIS — E559 Vitamin D deficiency, unspecified: Secondary | ICD-10-CM

## 2020-03-01 DIAGNOSIS — Z8719 Personal history of other diseases of the digestive system: Secondary | ICD-10-CM

## 2020-03-01 DIAGNOSIS — M722 Plantar fascial fibromatosis: Secondary | ICD-10-CM | POA: Diagnosis not present

## 2020-03-01 DIAGNOSIS — Z9884 Bariatric surgery status: Secondary | ICD-10-CM | POA: Diagnosis not present

## 2020-03-01 DIAGNOSIS — M170 Bilateral primary osteoarthritis of knee: Secondary | ICD-10-CM | POA: Diagnosis not present

## 2020-03-01 DIAGNOSIS — R7303 Prediabetes: Secondary | ICD-10-CM | POA: Diagnosis not present

## 2020-03-01 DIAGNOSIS — M1990 Unspecified osteoarthritis, unspecified site: Secondary | ICD-10-CM

## 2020-03-01 DIAGNOSIS — G4733 Obstructive sleep apnea (adult) (pediatric): Secondary | ICD-10-CM

## 2020-03-01 DIAGNOSIS — R768 Other specified abnormal immunological findings in serum: Secondary | ICD-10-CM | POA: Diagnosis not present

## 2020-03-09 ENCOUNTER — Other Ambulatory Visit (HOSPITAL_COMMUNITY): Payer: Self-pay | Admitting: Nurse Practitioner

## 2020-03-09 DIAGNOSIS — E669 Obesity, unspecified: Secondary | ICD-10-CM | POA: Diagnosis not present

## 2020-03-09 DIAGNOSIS — R7303 Prediabetes: Secondary | ICD-10-CM | POA: Diagnosis not present

## 2020-03-09 DIAGNOSIS — Z9884 Bariatric surgery status: Secondary | ICD-10-CM | POA: Diagnosis not present

## 2020-03-09 DIAGNOSIS — Z6836 Body mass index (BMI) 36.0-36.9, adult: Secondary | ICD-10-CM | POA: Diagnosis not present

## 2020-03-09 MED FILL — QSYMIA 7.5 MG-46 MG CAPSULE: 7.5-46 | 30 days supply | Qty: 30 | Fill #1

## 2020-04-11 MED FILL — QSYMIA 7.5 MG-46 MG CAPSULE: 7.5-46 | 30 days supply | Qty: 30 | Fill #0

## 2020-04-17 ENCOUNTER — Other Ambulatory Visit: Payer: Self-pay | Admitting: Nurse Practitioner

## 2020-04-17 ENCOUNTER — Encounter (HOSPITAL_COMMUNITY): Payer: Self-pay | Admitting: *Deleted

## 2020-04-17 ENCOUNTER — Telehealth (HOSPITAL_COMMUNITY): Payer: Self-pay | Admitting: *Deleted

## 2020-04-17 ENCOUNTER — Ambulatory Visit (HOSPITAL_COMMUNITY)
Admission: RE | Admit: 2020-04-17 | Discharge: 2020-04-17 | Disposition: A | Discharge status: Home / Self Care | Payer: 59 | Source: Ambulatory Visit | Attending: Pulmonary Disease | Admitting: Pulmonary Disease

## 2020-04-17 ENCOUNTER — Telehealth (INDEPENDENT_AMBULATORY_CARE_PROVIDER_SITE_OTHER): Payer: 59 | Admitting: Family Medicine

## 2020-04-17 ENCOUNTER — Encounter: Payer: Self-pay | Admitting: Nurse Practitioner

## 2020-04-17 ENCOUNTER — Encounter: Payer: Self-pay | Admitting: Family Medicine

## 2020-04-17 DIAGNOSIS — R55 Syncope and collapse: Secondary | ICD-10-CM | POA: Diagnosis not present

## 2020-04-17 DIAGNOSIS — U071 COVID-19: Secondary | ICD-10-CM | POA: Diagnosis not present

## 2020-04-17 MED ORDER — EPINEPHRINE 0.3 MG/0.3ML IJ SOAJ: 0.3000 mg | Freq: Once | INTRAMUSCULAR | Status: DC | PRN

## 2020-04-17 MED ORDER — METHYLPREDNISOLONE SODIUM SUCC 125 MG IJ SOLR: 125.0000 mg | Freq: Once | INTRAMUSCULAR | Status: DC | PRN

## 2020-04-17 MED ORDER — SODIUM CHLORIDE 0.9 % IV SOLN: Freq: Once | INTRAVENOUS | Status: AC

## 2020-04-17 MED ORDER — DIPHENHYDRAMINE HCL 50 MG/ML IJ SOLN: 50.0000 mg | Freq: Once | INTRAMUSCULAR | Status: DC | PRN

## 2020-04-17 MED ORDER — FAMOTIDINE IN NACL 20-0.9 MG/50ML-% IV SOLN: 20.0000 mg | Freq: Once | INTRAVENOUS | Status: DC | PRN

## 2020-04-17 MED ORDER — ALBUTEROL SULFATE HFA 108 (90 BASE) MCG/ACT IN AERS: 2.0000 | INHALATION_SPRAY | Freq: Once | RESPIRATORY_TRACT | Status: DC | PRN

## 2020-04-17 MED ORDER — SODIUM CHLORIDE 0.9 % IV SOLN: INTRAVENOUS | Status: DC | PRN

## 2020-04-17 NOTE — Progress Notes (Signed)
I connected by phone with Cheryl Craig on 04/17/2020 at 12:17 PM to discuss the potential use of a treatment for mild to moderate COVID-19 viral infection in non-hospitalized patients.  This patient is a 54 y.o. female that meets the FDA criteria for Emergency Use Authorization of bamlanivimab/etesevimab, casirivimab\imdevimab, or sotrovimab  Has a (+) direct SARS-CoV-2 viral test result  Has mild or moderate COVID-19   Is ? 54 years of age and weighs ? 40 kg  Is NOT hospitalized due to COVID-19  Is NOT requiring oxygen therapy or requiring an increase in baseline oxygen flow rate due to COVID-19  Is within 10 days of symptom onset  Has at least one of the high risk factor(s) for progression to severe COVID-19 and/or hospitalization as defined in EUA.  Specific high risk criteria : BMI > 25 and Other high risk medical condition per CDC:  SVI-4   I have spoken and communicated the following to the patient or parent/caregiver:  1. FDA has authorized the emergency use of bamlanivimab/etesevimab, casirivimab\imdevimab, or sotrovimab for the treatment of mild to moderate COVID-19 in adults and pediatric patients with positive results of direct SARS-CoV-2 viral testing who are 41 years of age and older weighing at least 40 kg, and who are at high risk for progressing to severe COVID-19 and/or hospitalization.  2. The significant known and potential risks and benefits of bamlanivimab/etesevimab, casirivimab\imdevimab, or sotrovimab, and the extent to which such potential risks and benefits are unknown.  3. Information on available alternative treatments and the risks and benefits of those alternatives, including clinical trials.  4. Patients treated with bamlanivimab/etesevimab, casirivimab\imdevimab, or sotrovimab should continue to self-isolate and use infection control measures (e.g., wear mask, isolate, social distance, avoid sharing personal items, clean and disinfect "high touch"  surfaces, and frequent handwashing) according to CDC guidelines.   5. The patient or parent/caregiver has the option to accept or refuse bamlanivimab/etesevimab, casirivimab\imdevimab, or sotrovimab.  After reviewing this information with the patient, the patient has agreed to receive one of the available covid 19 monoclonal antibodies and will be provided an appropriate fact sheet prior to infusion.Beckey Rutter, [place], AGNP-C [phone] ([place])

## 2020-04-17 NOTE — Telephone Encounter (Signed)
Called to Discuss with patient about Covid symptoms and the use of the monoclonal antibody infusion for those with mild to moderate Covid symptoms and at a high risk of hospitalization.     Pt appears to qualify for this infusion due to co-morbid conditions and/or a member of an at-risk group in accordance with the FDA Emergency Use Authorization.    Pt started having symptoms 04/12/20 and tested positive on  04/15/20 at the Urgent Care in [place]. Pt is feeling weak and has a cough.   Pt states she is 43ft 3in and weighs 205lbs, which makes her BMI 36.3.   Pt informed of the estimated costs of the treatment and provided the codes for billing.   Pt informed that an APP will be in touch with her.

## 2020-04-17 NOTE — Progress Notes (Signed)
Patient reviewed Fact Sheet for Patients, Parents, and Caregivers for Emergency Use Authorization (EUA) of bamlanivimab/etesevimab for the Treatment of Coronavirus.  Patient also reviewed and is agreeable to the estimated cost of treatment.  Patient is agreeable to proceed.  

## 2020-04-17 NOTE — Progress Notes (Signed)
Virtual Visit via Video Note  I connected with Cheryl Craig on 04/17/20 at 11:00 AM EST by a video enabled telemedicine application 2/2 [id number] pandemic and verified that I am speaking with the correct person using two identifiers.  Location patient: home Location provider:work or home office Persons participating in the virtual visit: patient, provider  I discussed the limitations of evaluation and management by telemedicine and the availability of in person appointments. The patient expressed understanding and agreed to proceed.   HPI: Pt started feeling like she had a cold Wednesday of last wk.  She felt achy on Thursday.  By Friday she scheduled a COVID test, it came back positive on Sunday.  Pt was around her sister-in-law who tested positive.  Pt having, HAs, congestion, diarrhea, cough, and dizzy. Taste is off.  Pt had blurred vision this am.  Pt thinks she passed out in the bathroom as she does not remember how she got from her bedroom to the hall bathroom.  Unsure if she fell as the back of her head and back sore.  Pt may have hit the commode. Pt denies bleeding.  Pt had some trail mix this am and oj.  Pt denies fever, ear pain or pressure, n/v, SOB. Taking mucinex and theraflu.  ROS: See pertinent positives and negatives per HPI.  Past Medical History:  Diagnosis Date  . GERD (gastroesophageal reflux disease)   . Obesity   . Plantar fasciitis     Past Surgical History:  Procedure Laterality Date  . CESAREAN SECTION    . LAPAROSCOPIC GASTRIC SLEEVE RESECTION N/A 04/01/2017   Procedure: LAPAROSCOPIC GASTRIC SLEEVE RESECTION REPAIR OF HIATAL HERNIA  WITH UPPER ENDOSCOPY;  Surgeon: Greer Pickerel, MD;  Location: [facility];  Service: General;  Laterality: N/A;    Family History  Problem Relation Age of Onset  . Arthritis Mother   . Alzheimer's disease Father   . Healthy Son   . Healthy Son   . Breast cancer Neg Hx      Current Outpatient Medications:  .  acetaminophen  (TYLENOL) 325 MG tablet, Take 650 mg by mouth every 6 (six) hours as needed for moderate pain or fever., Disp: , Rfl:  .  calcium-vitamin D (OSCAL WITH D) 500-200 MG-UNIT tablet, Take 1 tablet by mouth 3 (three) times daily., Disp: 90 tablet, Rfl: 1 .  Multiple Vitamins-Minerals (MULTIVITAMIN ADULT) TABS, Take 1 tablet by mouth 2 (two) times daily., Disp: 60 tablet, Rfl: 3 .  QSYMIA 7.5-46 MG CP24, Take 1 capsule by mouth daily., Disp: , Rfl:   EXAM:  VITALS per patient if applicable:  GENERAL: alert, oriented, appears sick but nontoxic and in no acute distress  HEENT: atraumatic, conjunctiva clear, no obvious abnormalities on inspection of external nose and ears  NECK: normal movements of the head and neck  LUNGS: on inspection no signs of respiratory distress, breathing rate appears normal, no obvious gross SOB, gasping or wheezing  CV: no obvious cyanosis  MS: moves all visible extremities without noticeable abnormality  PSYCH/NEURO: pleasant and cooperative, no obvious depression or anxiety, speech and thought processing grossly intact  ASSESSMENT AND PLAN:  Discussed the following assessment and plan:  COVID-19 virus infection -Positive test over the weekend -Discussed supportive care -Continue self quarantine -Patient info sent for possible antibody infusion -Patient given precautions  Syncope, unspecified syncope type -Discussed the importance of p.o. hydration -Not on a blood thinner or taking aspirin -Given current symptoms patient advised to proceed to the ED for  further evaluation.  Follow-up as needed.   I discussed the assessment and treatment plan with the patient. The patient was provided an opportunity to ask questions and all were answered. The patient agreed with the plan and demonstrated an understanding of the instructions.   The patient was advised to call back or seek an in-person evaluation if the symptoms worsen or if the condition fails to improve  as anticipated.   Billie Ruddy, MD

## 2020-04-17 NOTE — Discharge Instructions (Signed)
10 Things You Can Do to Manage Your COVID-19 Symptoms at Home If you have possible or confirmed COVID-19: 1. Stay home from work and school. And stay away from other public places. If you must go out, avoid using any kind of public transportation, ridesharing, or taxis. 2. Monitor your symptoms carefully. If your symptoms get worse, call your healthcare provider immediately. 3. Get rest and stay hydrated. 4. If you have a medical appointment, call the healthcare provider ahead of time and tell them that you have or may have COVID-19. 5. For medical emergencies, call 911 and notify the dispatch personnel that you have or may have COVID-19. 6. Cover your cough and sneezes with a tissue or use the inside of your elbow. 7. Wash your hands often with soap and water for at least 20 seconds or clean your hands with an alcohol-based hand sanitizer that contains at least 60% alcohol. 8. As much as possible, stay in a specific room and away from other people in your home. Also, you should use a separate bathroom, if available. If you need to be around other people in or outside of the home, wear a mask. 9. Avoid sharing personal items with other people in your household, like dishes, towels, and bedding. 10. Clean all surfaces that are touched often, like counters, tabletops, and doorknobs. Use household cleaning sprays or wipes according to the label instructions. cdc.gov/coronavirus 11/04/2018 This information is not intended to replace advice given to you by your health care provider. Make sure you discuss any questions you have with your health care provider. Document Revised: 04/08/2019 Document Reviewed: 04/08/2019 Elsevier Patient Education  2020 Elsevier Inc. What types of side effects do monoclonal antibody drugs cause?  Common side effects  In general, the more common side effects caused by monoclonal antibody drugs include: . Allergic reactions, such as hives or itching . Flu-like signs and  symptoms, including chills, fatigue, fever, and muscle aches and pains . Nausea, vomiting . Diarrhea . Skin rashes . Low blood pressure   The CDC is recommending patients who receive monoclonal antibody treatments wait at least 90 days before being vaccinated.  Currently, there are no data on the safety and efficacy of mRNA COVID-19 vaccines in persons who received monoclonal antibodies or convalescent plasma as part of COVID-19 treatment. Based on the estimated half-life of such therapies as well as evidence suggesting that reinfection is uncommon in the 90 days after initial infection, vaccination should be deferred for at least 90 days, as a precautionary measure until additional information becomes available, to avoid interference of the antibody treatment with vaccine-induced immune responses. If you have any questions or concerns after the infusion please call the Advanced Practice Provider on call at [phone]. This number is ONLY intended for your use regarding questions or concerns about the infusion post-treatment side-effects.  Please do not provide this number to others for use. For return to work notes please contact your primary care provider.   If someone you know is interested in receiving treatment please have them call the COVID hotline at [phone].   

## 2020-04-17 NOTE — Progress Notes (Signed)
°  Diagnosis: COVID-19  Physician: Dr. Asencion Noble  Procedure: Reviewed administrative charges of treatment with the patient, as well as common side-effects of therapy; pt indicates understanding and willingness to proceed with treatment.  Medication fact sheet provided to patient; all questions answered.  Allergies reviewed with patient.  IV placed.  bamlanivimab/etesevimab administered via IV infusion.  Complications: No immediate complications noted.  Discharge: Discharged home   Monna Fam 04/17/2020

## 2020-05-03 ENCOUNTER — Other Ambulatory Visit (HOSPITAL_COMMUNITY): Payer: Self-pay | Admitting: Oral and Maxillofacial Surgery

## 2020-05-03 MED FILL — CLOBETASOL PROPIONATE 0.05: 0.05 | 10 days supply | Qty: 30 | Fill #0

## 2020-05-03 MED FILL — GABAPENTIN 100 MG CAPSULE: 100 | 14 days supply | Qty: 14 | Fill #0

## 2020-05-18 MED FILL — QSYMIA 7.5 MG-46 MG CAPSULE: 7.5-46 | 30 days supply | Qty: 30 | Fill #1

## 2020-05-29 MED FILL — QSYMIA 7.5 MG-46 MG CAPSULE: 7.5-46 | 30 days supply | Qty: 30 | Fill #1

## 2020-06-24 MED FILL — GABAPENTIN 100 MG CAPSULE: 100 | 14 days supply | Qty: 14 | Fill #0

## 2020-09-11 ENCOUNTER — Ambulatory Visit: Payer: 59 | Admitting: Family Medicine

## 2020-09-11 ENCOUNTER — Other Ambulatory Visit: Payer: Self-pay

## 2020-09-11 VITALS — BP 118/68 | HR 68 | Temp 98.0°F | Resp 16 | Ht 64.0 in | Wt 214.8 lb

## 2020-09-11 DIAGNOSIS — B351 Tinea unguium: Secondary | ICD-10-CM | POA: Diagnosis not present

## 2020-09-11 DIAGNOSIS — R21 Rash and other nonspecific skin eruption: Secondary | ICD-10-CM | POA: Diagnosis not present

## 2020-09-11 LAB — HEPATIC FUNCTION PANEL
ALT: 23 U/L (ref 0–35)
AST: 18 U/L (ref 0–37)
Albumin: 4.1 g/dL (ref 3.5–5.2)
Alkaline Phosphatase: 86 U/L (ref 39–117)
Bilirubin, Direct: 0.1 mg/dL (ref 0.0–0.3)
Total Bilirubin: 0.5 mg/dL (ref 0.2–1.2)
Total Protein: 7.1 g/dL (ref 6.0–8.3)

## 2020-09-11 MED ORDER — TERBINAFINE HCL 250 MG PO TABS
250.0000 mg | ORAL_TABLET | Freq: Every day | ORAL | 0 refills | Status: DC
  Filled 2020-09-12: qty 90, 90d supply, fill #0

## 2020-09-11 NOTE — Progress Notes (Signed)
Established Patient Office Visit  Subjective:  Patient ID: Cheryl Craig, female    DOB: [birth year]  Age: 55 y.o. MRN: [id number]  CC:  Chief Complaint  Patient presents with  . rash on hand    HPI Cheryl Craig presents for rash on both palms as well as some atypical nail changes left thumb.  All these things were noted recently within the past month.  She is also noticed some abnormal thickening of her right second toenail.  Denies any prior history of fungal infections.  Rash on her palms is pruritic.  She is not tried any over-the-counter topicals.  She has history of obstructive sleep apnea and past history of laparoscopic sleeve gastrectomy.  She takes multivitamin supplement.  Past Medical History:  Diagnosis Date  . GERD (gastroesophageal reflux disease)   . Obesity   . Plantar fasciitis     Past Surgical History:  Procedure Laterality Date  . CESAREAN SECTION    . LAPAROSCOPIC GASTRIC SLEEVE RESECTION N/A 04/01/2017   Procedure: LAPAROSCOPIC GASTRIC SLEEVE RESECTION REPAIR OF HIATAL HERNIA  WITH UPPER ENDOSCOPY;  Surgeon: Greer Pickerel, MD;  Location: [facility];  Service: General;  Laterality: N/A;    Family History  Problem Relation Age of Onset  . Arthritis Mother   . Alzheimer's disease Father   . Healthy Son   . Healthy Son   . Breast cancer Neg Hx     Social History   Socioeconomic History  . Marital status: Married    Spouse name: Not on file  . Number of children: Not on file  . Years of education: Not on file  . Highest education level: Not on file  Occupational History  . Not on file  Tobacco Use  . Smoking status: Never Smoker  . Smokeless tobacco: Never Used  Vaping Use  . Vaping Use: Never used  Substance and Sexual Activity  . Alcohol use: Yes    Alcohol/week: 0.0 standard drinks    Comment: very rare, twice per year  . Drug use: No  . Sexual activity: Not on file  Other Topics Concern  . Not on file  Social History Narrative  .  Not on file   Social Determinants of Health   Financial Resource Strain: Not on file  Food Insecurity: Not on file  Transportation Needs: Not on file  Physical Activity: Not on file  Stress: Not on file  Social Connections: Not on file  Intimate Partner Violence: Not on file    Outpatient Medications Prior to Visit  Medication Sig Dispense Refill  . acetaminophen (TYLENOL) 325 MG tablet Take 650 mg by mouth every 6 (six) hours as needed for moderate pain or fever.    . calcium-vitamin D (OSCAL WITH D) 500-200 MG-UNIT tablet Take 1 tablet by mouth 3 (three) times daily. 90 tablet 1  . clobetasol ointment (TEMOVATE) 0.05 % APPLY A THIN AMOUNT TO THE AFFECTED AREA 3 - 4 TIMES DAILY. IGNORE EXTERNAL USE ONLY 30 g 0  . gabapentin (NEURONTIN) 100 MG capsule TAKE 1 CAPSULE BY MOUTH AT BEDTIME 14 capsule 0  . Multiple Vitamins-Minerals (MULTIVITAMIN ADULT) TABS Take 1 tablet by mouth 2 (two) times daily. 60 tablet 3  . Phentermine-Topiramate 7.5-46 MG CP24 TAKE 1 CAPSULE BY MOUTH ONCE DAILY. 30 capsule 1  . QSYMIA 7.5-46 MG CP24 Take 1 capsule by mouth daily.     No facility-administered medications prior to visit.    No Known Allergies  ROS Review of  Systems  Constitutional: Negative for chills and fever.  Skin: Positive for rash.      Objective:    Physical Exam Vitals reviewed.  Constitutional:      Appearance: Normal appearance.  Cardiovascular:     Rate and Rhythm: Normal rate and regular rhythm.  Skin:    Comments: She has scaly rash both palms.  No pustules.  No vesicles.  Nontender.  No interdigital involvement.  Left thumb reveals some separation of the nail from underneath about halfway down.  She also has some abnormal thickening of the nail near this lateral medial border  Right second toenail reveals abnormal thickening  Neurological:     Mental Status: She is alert.     BP 118/68   Pulse 68   Temp 98 F (36.7 C) (Oral)   Resp 16   Ht 5\' 4"  (1.626 m)    Wt 214 lb 12.8 oz (97.4 kg)   LMP 09/03/2018   SpO2 99%   BMI 36.87 kg/m  Wt Readings from Last 3 Encounters:  09/11/20 214 lb 12.8 oz (97.4 kg)  03/01/20 219 lb 12.8 oz (99.7 kg)  02/07/20 220 lb 3.2 oz (99.9 kg)     Health Maintenance Due  Topic Date Due  . HIV Screening  Never done  . Hepatitis C Screening  Never done  . COVID-19 Vaccine (3 - Booster for Pfizer series) 05/11/2020    There are no preventive care reminders to display for this patient.  Lab Results  Component Value Date   TSH 0.72 11/04/2019   Lab Results  Component Value Date   WBC 6.1 11/04/2019   HGB 11.7 (L) 11/04/2019   HCT 35.6 (L) 11/04/2019   MCV 89.3 11/04/2019   PLT 314.0 11/04/2019   Lab Results  Component Value Date   NA 140 11/04/2019   K 4.2 11/04/2019   CO2 29 11/04/2019   GLUCOSE 93 11/04/2019   BUN 10 11/04/2019   CREATININE 0.89 11/04/2019   BILITOT 0.7 11/04/2019   ALKPHOS 83 11/04/2019   AST 18 11/04/2019   ALT 16 11/04/2019   PROT 7.6 11/04/2019   ALBUMIN 4.4 11/04/2019   CALCIUM 9.7 11/04/2019   ANIONGAP 3 (L) 04/02/2017   GFR 80.64 11/04/2019   Lab Results  Component Value Date   CHOL 222 (H) 09/24/2019   Lab Results  Component Value Date   HDL 83.40 09/24/2019   Lab Results  Component Value Date   LDLCALC 124 (H) 09/24/2019   Lab Results  Component Value Date   TRIG 70.0 09/24/2019   Lab Results  Component Value Date   CHOLHDL 3 09/24/2019   Lab Results  Component Value Date   HGBA1C 5.9 11/04/2019      Assessment & Plan:   Patient presents with palmar rash bilaterally.  No evidence for pustular psoriasis.  Differential could include eczema type rash versus fungal.  She does have some nail changes involving the left thumb which suggest possible onychomycosis with some abnormal thickening and onycholysis changes.  She also has abnormal thickening right second toenail.  -Obtain hepatic panel -We clipped off portion of the left thumb nail and will  send for fungal smear and culture -Consider trial of Lamisil 250 mg once daily for 3 months if liver panel normal   Meds ordered this encounter  Medications  . terbinafine (LAMISIL) 250 MG tablet    Sig: Take 1 tablet (250 mg total) by mouth daily.    Dispense:  90 tablet  Refill:  0    Follow-up: No follow-ups on file.    Carolann Littler, MD

## 2020-09-11 NOTE — Patient Instructions (Signed)
Fungal Nail Infection A fungal nail infection is a common infection of the toenails or fingernails. This condition affects toenails more often than fingernails. It often affects the great, or big, toes. More than one nail may be infected. The condition can be passed from person to person (is contagious). What are the causes? This condition is caused by a fungus. Several types of fungi can cause the infection. These fungi are common in moist and warm areas. If your hands or feet come into contact with the fungus, it may get into a crack in your fingernail or toenail and cause the infection. What increases the risk? The following factors may make you more likely to develop this condition:  Being female.  Being of older age.  Living with someone who has the fungus.  Walking barefoot in areas where the fungus thrives, such as showers or locker rooms.  Wearing shoes and socks that cause your feet to sweat.  Having a nail injury or a recent nail surgery.  Having certain medical conditions, such as: ? Athlete's foot. ? Diabetes. ? Psoriasis. ? Poor circulation. ? A weak body defense system (immune system). What are the signs or symptoms? Symptoms of this condition include:  A pale spot on the nail.  Thickening of the nail.  A nail that becomes yellow or brown.  A brittle or ragged nail edge.  A crumbling nail.  A nail that has lifted away from the nail bed.   How is this diagnosed? This condition is diagnosed with a physical exam. Your health care provider may take a scraping or clipping from your nail to test for the fungus. How is this treated? Treatment is not needed for mild infections. If you have significant nail changes, treatment may include:  Antifungal medicines taken by mouth (orally). You may need to take the medicine for several weeks or several months, and you may not see the results for a long time. These medicines can cause side effects. Ask your health care  provider what problems to watch for.  Antifungal nail polish or nail cream. These may be used along with oral antifungal medicines.  Laser treatment of the nail.  Surgery to remove the nail. This may be needed for the most severe infections. It can take a long time, usually up to a year, for the infection to go away. The infection may also come back.   Follow these instructions at home: Medicines  Take or apply over-the-counter and prescription medicines only as told by your health care provider.  Ask your health care provider about using over-the-counter mentholated ointment on your nails. Nail care  Trim your nails often.  Wash and dry your hands and feet every day.  Keep your feet dry: ? Wear absorbent socks, and change your socks frequently. ? Wear shoes that allow air to circulate, such as sandals or canvas tennis shoes. Throw out old shoes.  Do not use artificial nails.  If you go to a nail salon, make sure you choose one that uses clean instruments.  Use antifungal foot powder on your feet and in your shoes. General instructions  Do not share personal items, such as towels or nail clippers.  Do not walk barefoot in shower rooms or locker rooms.  Wear rubber gloves if you are working with your hands in wet areas.  Keep all follow-up visits as told by your health care provider. This is important. Contact a health care provider if: Your infection is not getting better or   it is getting worse after several months. Summary  A fungal nail infection is a common infection of the toenails or fingernails.  Treatment is not needed for mild infections. If you have significant nail changes, treatment may include taking medicine orally and applying medicine to your nails.  It can take a long time, usually up to a year, for the infection to go away. The infection may also come back.  Take or apply over-the-counter and prescription medicines only as told by your health care  provider.  Follow instructions for taking care of your nails to help prevent infection from coming back or spreading. This information is not intended to replace advice given to you by your health care provider. Make sure you discuss any questions you have with your health care provider. Document Revised: 08/13/2018 Document Reviewed: 09/26/2017 Elsevier Patient Education  2021 Elsevier Inc.  

## 2020-09-12 ENCOUNTER — Other Ambulatory Visit (HOSPITAL_COMMUNITY): Payer: Self-pay

## 2020-09-14 ENCOUNTER — Ambulatory Visit: Payer: 59 | Admitting: Family Medicine

## 2020-09-14 ENCOUNTER — Other Ambulatory Visit: Payer: Self-pay

## 2020-09-14 ENCOUNTER — Encounter: Payer: Self-pay | Admitting: Family Medicine

## 2020-09-14 VITALS — BP 112/76 | HR 72 | Temp 98.1°F | Wt 212.4 lb

## 2020-09-14 DIAGNOSIS — M7031 Other bursitis of elbow, right elbow: Secondary | ICD-10-CM

## 2020-09-14 DIAGNOSIS — F439 Reaction to severe stress, unspecified: Secondary | ICD-10-CM | POA: Diagnosis not present

## 2020-09-14 DIAGNOSIS — M7711 Lateral epicondylitis, right elbow: Secondary | ICD-10-CM

## 2020-09-14 NOTE — Progress Notes (Signed)
Subjective:    Patient ID: Cheryl Craig, female    DOB: 1966/04/02, 55 y.o.   MRN: [id number]  Chief Complaint  Patient presents with  . Mass    Right elbow. Noticed last night, been sore for a few weeks but yesterday had sharp pains in rt arm. Lump is soft and moveable.    HPI Patient was seen today for f/u. Pt seen 3 days ago in clinic by Dr. Elease Hashimoto for onychomycosis and rash.  Patient states at that time she was having elbow pain.  Endorses continued elbow pain/soreness with shooting pain into right forearm but noticed a lump in right elbow last night.  The area is soft and mobile.  Patient denies recent falls, heavy lifting, pushing pulling, fever, joint pain, chills..  Patient is left-handed.  Tried ice.   Pt's husband Cheryl Craig was recently diagnosed with advanced stage sinus cancer.  He is currently undergoing chemo and considering aggressive surgery which would involve removing his left eye and half of his face.  Pt considering EAP for her and her 2 sons Cheryl Craig and Cheryl Craig.  Pt also notes her father-in-law recently had a hip fracture and is in rehab.  Pt's brother-in-law recently had an MI in [place].  Past Medical History:  Diagnosis Date  . GERD (gastroesophageal reflux disease)   . Obesity   . Plantar fasciitis   Patient works at the [employer].  No Known Allergies  ROS General: Denies fever, chills, night sweats, changes in weight, changes in appetite HEENT: Denies headaches, ear pain, changes in vision, rhinorrhea, sore throat CV: Denies CP, palpitations, SOB, orthopnea Pulm: Denies SOB, cough, wheezing GI: Denies abdominal pain, nausea, vomiting, diarrhea, constipation GU: Denies dysuria, hematuria, frequency, vaginal discharge Msk: Denies muscle cramps, joint pains + right elbow pain Neuro: Denies weakness, numbness, tingling Skin: Denies rashes, bruising  + mobile mass in right elbow.  Lipoma right shoulder Psych: Denies depression, anxiety, hallucinations  +  stress     Objective:    Blood pressure 112/76, pulse 72, temperature 98.1 F (36.7 C), temperature source Oral, weight 212 lb 6.4 oz (96.3 kg), last menstrual period 09/03/2018, SpO2 99 %.  Gen. Pleasant, well-nourished, in no distress, normal affect   HEENT: [place]/AT, face symmetric, conjunctiva clear, no scleral icterus, PERRLA, EOMI, nares patent without drainage Lungs: no accessory muscle use Cardiovascular: RRR,no peripheral edema Musculoskeletal: TTP of right lateral epicondyle.  Normal ROM of right elbow and wrist.  No deformities, no cyanosis or clubbing, normal tone Neuro:  A&Ox3, CN II-XII intact, normal gait Skin:  Warm, no lesions/ rash.  A mobile, soft lipoma of right anterior shoulder near insertion of long head of biceps brachii.  A mobile, fluid-filled sac and lateral right elbow.  Wt Readings from Last 3 Encounters:  09/14/20 212 lb 6.4 oz (96.3 kg)  09/11/20 214 lb 12.8 oz (97.4 kg)  03/01/20 219 lb 12.8 oz (99.7 kg)    Lab Results  Component Value Date   WBC 6.1 11/04/2019   HGB 11.7 (L) 11/04/2019   HCT 35.6 (L) 11/04/2019   PLT 314.0 11/04/2019   GLUCOSE 93 11/04/2019   CHOL 222 (H) 09/24/2019   TRIG 70.0 09/24/2019   HDL 83.40 09/24/2019   LDLCALC 124 (H) 09/24/2019   ALT 23 09/11/2020   AST 18 09/11/2020   NA 140 11/04/2019   K 4.2 11/04/2019   CL 104 11/04/2019   CREATININE 0.89 11/04/2019   BUN 10 11/04/2019   CO2 29 11/04/2019  TSH 0.72 11/04/2019   INR 1.0 05/13/2019   HGBA1C 5.9 11/04/2019    Assessment/Plan:  Right lateral epicondylitis -Discussed treatment symptoms including Tylenol, NSAIDs, rest, heat, compression -Can also use topical Aspercreme twice daily -For continued symptoms consider prednisone taper  Radiohumeral bursitis of right elbow -Continue treatment of symptoms as above  Stress -Self-care encouraged -Given information for counseling/area [facility] providers   F/u as needed in the next few weeks  Grier Mitts, MD

## 2020-10-11 LAB — CULTURE, FUNGUS WITHOUT SMEAR
CULTURE:: NO GROWTH
MICRO NUMBER:: 11866310
SPECIMEN QUALITY:: ADEQUATE

## 2020-10-25 ENCOUNTER — Encounter (HOSPITAL_COMMUNITY): Payer: Self-pay | Admitting: *Deleted

## 2020-10-25 ENCOUNTER — Other Ambulatory Visit: Payer: Self-pay | Admitting: Family Medicine

## 2020-10-25 DIAGNOSIS — Z1231 Encounter for screening mammogram for malignant neoplasm of breast: Secondary | ICD-10-CM

## 2020-10-27 ENCOUNTER — Other Ambulatory Visit: Payer: Self-pay

## 2020-10-30 ENCOUNTER — Ambulatory Visit (INDEPENDENT_AMBULATORY_CARE_PROVIDER_SITE_OTHER): Payer: 59 | Admitting: Family Medicine

## 2020-10-30 ENCOUNTER — Other Ambulatory Visit: Payer: Self-pay

## 2020-10-30 ENCOUNTER — Encounter: Payer: Self-pay | Admitting: Family Medicine

## 2020-10-30 VITALS — BP 120/78 | HR 82 | Temp 98.0°F | Ht 64.0 in | Wt 220.2 lb

## 2020-10-30 DIAGNOSIS — H9193 Unspecified hearing loss, bilateral: Secondary | ICD-10-CM

## 2020-10-30 DIAGNOSIS — M25521 Pain in right elbow: Secondary | ICD-10-CM

## 2020-10-30 DIAGNOSIS — E782 Mixed hyperlipidemia: Secondary | ICD-10-CM | POA: Diagnosis not present

## 2020-10-30 DIAGNOSIS — Z9884 Bariatric surgery status: Secondary | ICD-10-CM | POA: Diagnosis not present

## 2020-10-30 DIAGNOSIS — B351 Tinea unguium: Secondary | ICD-10-CM

## 2020-10-30 DIAGNOSIS — H6123 Impacted cerumen, bilateral: Secondary | ICD-10-CM

## 2020-10-30 DIAGNOSIS — Z0000 Encounter for general adult medical examination without abnormal findings: Secondary | ICD-10-CM

## 2020-10-30 DIAGNOSIS — R232 Flushing: Secondary | ICD-10-CM

## 2020-10-30 DIAGNOSIS — M1711 Unilateral primary osteoarthritis, right knee: Secondary | ICD-10-CM

## 2020-10-30 DIAGNOSIS — Z1159 Encounter for screening for other viral diseases: Secondary | ICD-10-CM

## 2020-10-30 LAB — COMPREHENSIVE METABOLIC PANEL
ALT: 15 U/L (ref 0–35)
AST: 17 U/L (ref 0–37)
Albumin: 4.2 g/dL (ref 3.5–5.2)
Alkaline Phosphatase: 72 U/L (ref 39–117)
BUN: 13 mg/dL (ref 6–23)
CO2: 28 mEq/L (ref 19–32)
Calcium: 9.2 mg/dL (ref 8.4–10.5)
Chloride: 104 mEq/L (ref 96–112)
Creatinine, Ser: 0.85 mg/dL (ref 0.40–1.20)
GFR: 78.54 mL/min (ref >60.00)
Glucose, Bld: 82 mg/dL (ref 70–99)
Potassium: 3.9 mEq/L (ref 3.5–5.1)
Sodium: 141 mEq/L (ref 135–145)
Total Bilirubin: 0.6 mg/dL (ref 0.2–1.2)
Total Protein: 7.1 g/dL (ref 6.0–8.3)

## 2020-10-30 LAB — HEMOGLOBIN A1C: Hgb A1c MFr Bld: 5.9 % (ref 4.6–6.5)

## 2020-10-30 LAB — LIPID PANEL
Cholesterol: 240 mg/dL — ABNORMAL HIGH (ref 0–200)
HDL: 87.4 mg/dL (ref >39.00)
LDL Cholesterol: 137 mg/dL — ABNORMAL HIGH (ref 0–99)
NonHDL: 152.2
Total CHOL/HDL Ratio: 3
Triglycerides: 74 mg/dL (ref 0.0–149.0)
VLDL: 14.8 mg/dL (ref 0.0–40.0)

## 2020-10-30 LAB — CBC WITH DIFFERENTIAL/PLATELET
Basophils Absolute: 0 10*3/uL (ref 0.0–0.1)
Basophils Relative: 0.3 % (ref 0.0–3.0)
Eosinophils Absolute: 0.2 10*3/uL (ref 0.0–0.7)
Eosinophils Relative: 3.4 % (ref 0.0–5.0)
HCT: 35.3 % — ABNORMAL LOW (ref 36.0–46.0)
Hemoglobin: 11.7 g/dL — ABNORMAL LOW (ref 12.0–15.0)
Lymphocytes Relative: 43.9 % (ref 12.0–46.0)
Lymphs Abs: 2.6 10*3/uL (ref 0.7–4.0)
MCHC: 33.2 g/dL (ref 30.0–36.0)
MCV: 88.6 fl (ref 78.0–100.0)
Monocytes Absolute: 0.4 10*3/uL (ref 0.1–1.0)
Monocytes Relative: 6.5 % (ref 3.0–12.0)
Neutro Abs: 2.7 10*3/uL (ref 1.4–7.7)
Neutrophils Relative %: 45.9 % (ref 43.0–77.0)
Platelets: 269 10*3/uL (ref 150.0–400.0)
RBC: 3.98 Mil/uL (ref 3.87–5.11)
RDW: 14.4 % (ref 11.5–15.5)
WBC: 6 10*3/uL (ref 4.0–10.5)

## 2020-10-30 LAB — T4, FREE: Free T4: 0.82 ng/dL (ref 0.60–1.60)

## 2020-10-30 LAB — TSH: TSH: 1.14 u[IU]/mL (ref 0.35–4.50)

## 2020-10-30 LAB — VITAMIN D 25 HYDROXY (VIT D DEFICIENCY, FRACTURES): VITD: 69.61 ng/mL (ref 30.00–100.00)

## 2020-10-30 LAB — VITAMIN B12: Vitamin B-12: 1497 pg/mL — ABNORMAL HIGH (ref 211–911)

## 2020-10-30 NOTE — Progress Notes (Signed)
Subjective:     Cheryl Craig is a 55 y.o. female and is here for a comprehensive physical exam. The patient reports  working on increased stress by walking 2 miles per day.  Pt trying to be mindful of her health as her husband was diagnosed with an aggressive cancer in his sinuses.  Pt notes continued b/l knee pain 2/2 OA.  Knee pain better with walking.  Patient denies AM stiffness in joints.  Tried aspercreme without relief. Right elbow continues to feel sore especially with extending arm.  Bump underneath skin remains but has not increased in size.  Endorses decreased hearing.  Turning the volume up on the TV.  Patient also having hot flashes.  Inquires about what can be done.  Has mammogram scheduled in the next few days.  Taking Lamisil p.o. for fingernail fungus.  Also notes similar thickening of toenail without discoloration.  Patient started medicine on 09/11/2020.  Patient notes weight gain as eating more convenience foods.  Social History   Socioeconomic History   Marital status: Married    Spouse name: Not on file   Number of children: Not on file   Years of education: Not on file   Highest education level: Not on file  Occupational History   Not on file  Tobacco Use   Smoking status: Never   Smokeless tobacco: Never  Vaping Use   Vaping Use: Never used  Substance and Sexual Activity   Alcohol use: Yes    Alcohol/week: 0.0 standard drinks    Comment: very rare, twice per year   Drug use: No   Sexual activity: Not on file  Other Topics Concern   Not on file  Social History Narrative   Not on file   Social Determinants of Health   Financial Resource Strain: Not on file  Food Insecurity: Not on file  Transportation Needs: Not on file  Physical Activity: Not on file  Stress: Not on file  Social Connections: Not on file  Intimate Partner Violence: Not on file   Health Maintenance  Topic Date Due   HIV Screening  Never done   Hepatitis C Screening  Never done    Zoster Vaccines- Shingrix (1 of 2) Never done   COVID-19 Vaccine (3 - Booster for Pfizer series) 04/10/2020   INFLUENZA VACCINE  12/04/2020   MAMMOGRAM  09/29/2021   PAP SMEAR-Modifier  11/04/2022   TETANUS/TDAP  11/03/2029   COLONOSCOPY (Pts 45-27yrs Insurance coverage will need to be confirmed)  02/19/2030   Pneumococcal Vaccine 77-71 Years old  Aged Out   HPV VACCINES  Aged Out    The following portions of the patient's history were reviewed and updated as appropriate: allergies, current medications, past family history, past medical history, past social history, past surgical history, and problem list.  Review of Systems Pertinent items noted in HPI and remainder of comprehensive ROS otherwise negative.   Objective:    BP 120/78 (BP Location: Left Arm, Patient Position: Sitting, Cuff Size: Large)   Pulse 82   Temp 98 F (36.7 C) (Oral)   Ht 5\' 4"  (1.626 m)   Wt 220 lb 3.2 oz (99.9 kg)   LMP 09/03/2018   SpO2 92%   BMI 37.80 kg/m  General appearance: alert, cooperative, and no distress Head: Normocephalic, without obvious abnormality, atraumatic Eyes: conjunctivae/corneas clear. PERRL, EOM's intact. Fundi benign. Ears: normal TM's and external ear canals both ears Nose: Nares normal. Septum midline. Mucosa normal. No drainage or sinus tenderness.  Throat: lips, mucosa, and tongue normal; teeth and gums normal Neck: no adenopathy, no carotid bruit, no JVD, supple, symmetrical, trachea midline, and thyroid not enlarged, symmetric, no tenderness/mass/nodules Lungs: clear to auscultation bilaterally Heart: regular rate and rhythm, S1, S2 normal, no murmur, click, rub or gallop Abdomen: soft, non-tender; bowel sounds normal; no masses,  no organomegaly Extremities: extremities normal, atraumatic, no cyanosis or edema and R lateral elbow at epicondyle with a soft, mobile mass 2 cm x1.5 cm.  TTP of R lateral arm Pulses: 2+ and symmetric Skin: Skin color, texture, turgor normal. No  rashes or lesions, onychomycosis of left first and third digits of hand and right toenail. Lymph nodes: Cervical, supraclavicular, and axillary nodes normal. Neurologic: Alert and oriented X 3, normal strength and tone. Normal symmetric reflexes. Normal coordination and gait    Assessment:    Healthy female exam.      Plan:    Anticipatory guidance given including wearing seatbelts, smoke detectors in the home, increasing physical activity, increasing p.o. intake of water and vegetables. -will obtain labs -pt has mammogram schedule in the next few wks -Colonoscopy up-to-date, done 02/20/2020 -Tdap up-to-date done 11/04/2019 -Given handout -Next CPE in 1 year See After Visit Summary for Counseling Recommendations   Right elbow pain  -likely 2/2 lipoma causing nerve compression. -Consider imaging and referral to surgery for removal for continued or worsening symptoms - Plan: CBC with Differential/Platelet  Primary osteoarthritis of right knee  - Plan: CBC with Differential/Platelet  Onychomycosis  -We will obtain CMP to assess liver function -Continue Lamisil 250 mg daily. -Advised on the likely duration of treatment - Plan: Comprehensive metabolic panel  Mixed hyperlipidemia  - Plan: Lipid panel  History of bariatric surgery  - Plan: Hemoglobin A1c, Vitamin B12, Vitamin D, 25-hydroxy  Encounter for hepatitis C screening test for low risk patient  - Plan: Hep C Antibody  Bilateral impacted cerumen -Consent obtained.  Bilateral ears irrigated.  Patient tolerated procedure well. -OTC Debrox eardrops -Given handout  Decreased hearing of both ears -Improved after cerumen removed with irrigation. -Passed hearing test  Hot flashes -Likely 2/2 menopause -Discussed OTC options including black cohosh and Estroven -For continued or worsening symptoms follow-up.  Consider gabapentin.  Follow-up as needed  Arleny Kruger, MD

## 2020-10-31 ENCOUNTER — Ambulatory Visit
Admission: RE | Admit: 2020-10-31 | Discharge: 2020-10-31 | Disposition: A | Discharge status: Home / Self Care | Payer: 59 | Source: Ambulatory Visit | Attending: Family Medicine | Admitting: Family Medicine

## 2020-10-31 DIAGNOSIS — Z1231 Encounter for screening mammogram for malignant neoplasm of breast: Secondary | ICD-10-CM | POA: Diagnosis not present

## 2020-10-31 LAB — HEPATITIS C ANTIBODY
Hepatitis C Ab: NONREACTIVE
SIGNAL TO CUT-OFF: 0.06 (ref <1.00)

## 2020-11-01 ENCOUNTER — Other Ambulatory Visit: Payer: Self-pay | Admitting: Family Medicine

## 2020-11-01 DIAGNOSIS — E782 Mixed hyperlipidemia: Secondary | ICD-10-CM

## 2020-11-08 ENCOUNTER — Encounter: Payer: 59 | Admitting: Family Medicine

## 2020-11-14 ENCOUNTER — Telehealth: Payer: Self-pay | Admitting: Family Medicine

## 2020-11-14 DIAGNOSIS — D179 Benign lipomatous neoplasm, unspecified: Secondary | ICD-10-CM

## 2020-11-14 DIAGNOSIS — M25521 Pain in right elbow: Secondary | ICD-10-CM

## 2020-11-14 NOTE — Telephone Encounter (Signed)
Patient called needing a referral to have a lymphoma removed. She stated that it is getting bigger and pushing against her nerves causing pain in her arm.  I advised the patient that Dr. Volanda Napoleon is out of the office on Tuesdays  Please Advise

## 2020-11-24 NOTE — Addendum Note (Signed)
Addended by: Anderson Malta on: 11/24/2020 09:57 AM   Modules accepted: Orders

## 2020-11-24 NOTE — Telephone Encounter (Signed)
-   Referral to Dermatology placed

## 2020-12-28 DIAGNOSIS — D179 Benign lipomatous neoplasm, unspecified: Secondary | ICD-10-CM | POA: Diagnosis not present

## 2020-12-29 ENCOUNTER — Other Ambulatory Visit (HOSPITAL_COMMUNITY): Payer: Self-pay

## 2020-12-29 DIAGNOSIS — Z9884 Bariatric surgery status: Secondary | ICD-10-CM | POA: Diagnosis not present

## 2020-12-29 DIAGNOSIS — Z6838 Body mass index (BMI) 38.0-38.9, adult: Secondary | ICD-10-CM | POA: Diagnosis not present

## 2020-12-29 DIAGNOSIS — E669 Obesity, unspecified: Secondary | ICD-10-CM | POA: Diagnosis not present

## 2020-12-29 DIAGNOSIS — E785 Hyperlipidemia, unspecified: Secondary | ICD-10-CM | POA: Diagnosis not present

## 2020-12-29 MED ORDER — QSYMIA 7.5-46 MG PO CP24
1.0000 | ORAL_CAPSULE | Freq: Every day | ORAL | 1 refills | Status: DC
  Filled 2020-12-29: qty 30, 30d supply, fill #0
  Filled 2021-02-06: qty 30, 30d supply, fill #1

## 2021-02-06 ENCOUNTER — Other Ambulatory Visit (HOSPITAL_COMMUNITY): Payer: Self-pay

## 2021-02-08 ENCOUNTER — Other Ambulatory Visit (HOSPITAL_COMMUNITY): Payer: Self-pay

## 2021-02-14 ENCOUNTER — Other Ambulatory Visit (HOSPITAL_BASED_OUTPATIENT_CLINIC_OR_DEPARTMENT_OTHER): Payer: Self-pay

## 2021-02-14 MED ORDER — INFLUENZA VAC SPLIT QUAD 0.5 ML IM SUSY
PREFILLED_SYRINGE | INTRAMUSCULAR | 0 refills | Status: DC
  Filled 2021-02-14: qty 0.5, 1d supply, fill #0

## 2021-03-15 ENCOUNTER — Ambulatory Visit: Payer: 59 | Attending: Internal Medicine

## 2021-03-15 ENCOUNTER — Other Ambulatory Visit (HOSPITAL_BASED_OUTPATIENT_CLINIC_OR_DEPARTMENT_OTHER): Payer: Self-pay

## 2021-03-15 DIAGNOSIS — Z23 Encounter for immunization: Secondary | ICD-10-CM

## 2021-03-15 MED ORDER — PFIZER COVID-19 VAC BIVALENT 30 MCG/0.3ML IM SUSP
INTRAMUSCULAR | 0 refills | Status: DC
  Filled 2021-03-15: qty 0.3, 1d supply, fill #0

## 2021-03-15 NOTE — Progress Notes (Signed)
   Covid-19 Vaccination Clinic  Name:  Cheryl Craig    MRN: [id number] DOB: 1965/10/31  03/15/2021  Cheryl Craig was observed post Covid-19 immunization for 15 minutes without incident. She was provided with Vaccine Information Sheet and instruction to access the V-Safe system.   Cheryl Craig was instructed to call 911 with any severe reactions post vaccine: Difficulty breathing  Swelling of face and throat  A fast heartbeat  A bad rash all over body  Dizziness and weakness   Immunizations Administered     Name Date Dose VIS Date Route   Pfizer Covid-19 Vaccine Bivalent Booster 03/15/2021 11:00 AM 0.3 mL 01/03/2021 Intramuscular   Manufacturer: [place]   Lot: [id number]   [place]: [phone]

## 2021-05-16 ENCOUNTER — Other Ambulatory Visit (HOSPITAL_COMMUNITY): Payer: Self-pay

## 2021-05-16 MED ORDER — CARESTART COVID-19 HOME TEST VI KIT
PACK | 0 refills | Status: DC
  Filled 2021-05-16: qty 4, 4d supply, fill #0

## 2021-05-25 ENCOUNTER — Other Ambulatory Visit (HOSPITAL_COMMUNITY): Payer: Self-pay

## 2021-07-04 ENCOUNTER — Other Ambulatory Visit (HOSPITAL_COMMUNITY): Payer: Self-pay

## 2021-07-04 DIAGNOSIS — K9189 Other postprocedural complications and disorders of digestive system: Secondary | ICD-10-CM | POA: Diagnosis not present

## 2021-07-04 DIAGNOSIS — Z6839 Body mass index (BMI) 39.0-39.9, adult: Secondary | ICD-10-CM | POA: Diagnosis not present

## 2021-07-04 DIAGNOSIS — D508 Other iron deficiency anemias: Secondary | ICD-10-CM | POA: Diagnosis not present

## 2021-07-04 DIAGNOSIS — Z9884 Bariatric surgery status: Secondary | ICD-10-CM | POA: Diagnosis not present

## 2021-07-04 DIAGNOSIS — K912 Postsurgical malabsorption, not elsewhere classified: Secondary | ICD-10-CM | POA: Diagnosis not present

## 2021-07-04 DIAGNOSIS — E669 Obesity, unspecified: Secondary | ICD-10-CM | POA: Diagnosis not present

## 2021-07-04 DIAGNOSIS — R7303 Prediabetes: Secondary | ICD-10-CM | POA: Diagnosis not present

## 2021-07-04 MED ORDER — WEGOVY 0.5 MG/0.5ML ~~LOC~~ SOAJ
0.5000 mg | SUBCUTANEOUS | 0 refills | Status: DC
  Filled 2021-07-31: qty 2, 28d supply, fill #0

## 2021-07-04 MED ORDER — WEGOVY 0.25 MG/0.5ML ~~LOC~~ SOAJ
0.2500 mg | SUBCUTANEOUS | 0 refills | Status: AC
  Filled 2021-07-04 – 2021-07-06 (×3): qty 2, 28d supply, fill #0

## 2021-07-06 ENCOUNTER — Other Ambulatory Visit (HOSPITAL_COMMUNITY): Payer: Self-pay

## 2021-07-09 ENCOUNTER — Other Ambulatory Visit (HOSPITAL_COMMUNITY): Payer: Self-pay

## 2021-07-17 ENCOUNTER — Other Ambulatory Visit (HOSPITAL_COMMUNITY): Payer: Self-pay

## 2021-07-31 ENCOUNTER — Other Ambulatory Visit (HOSPITAL_COMMUNITY): Payer: Self-pay

## 2021-08-01 ENCOUNTER — Other Ambulatory Visit (HOSPITAL_COMMUNITY): Payer: Self-pay

## 2021-08-03 ENCOUNTER — Other Ambulatory Visit (HOSPITAL_COMMUNITY): Payer: Self-pay

## 2021-08-03 MED ORDER — HYDROCODONE-ACETAMINOPHEN 5-325 MG PO TABS
1.0000 | ORAL_TABLET | Freq: Four times a day (QID) | ORAL | 0 refills | Status: DC | PRN
  Filled 2021-08-03: qty 20, 5d supply, fill #0

## 2021-08-13 ENCOUNTER — Other Ambulatory Visit (HOSPITAL_COMMUNITY): Payer: Self-pay

## 2021-08-13 MED ORDER — FLURBIPROFEN 100 MG PO TABS
100.0000 mg | ORAL_TABLET | Freq: Three times a day (TID) | ORAL | 0 refills | Status: DC
  Filled 2021-08-13: qty 20, 7d supply, fill #0

## 2021-08-14 ENCOUNTER — Other Ambulatory Visit (HOSPITAL_COMMUNITY): Payer: Self-pay

## 2021-08-29 ENCOUNTER — Other Ambulatory Visit (HOSPITAL_COMMUNITY): Payer: Self-pay

## 2021-08-29 MED ORDER — WEGOVY 0.5 MG/0.5ML ~~LOC~~ SOAJ
0.5000 mg | SUBCUTANEOUS | 0 refills | Status: DC
  Filled 2021-08-29 – 2022-03-07 (×3): qty 2, 28d supply, fill #0

## 2021-08-30 ENCOUNTER — Other Ambulatory Visit (HOSPITAL_COMMUNITY): Payer: Self-pay

## 2021-08-30 MED ORDER — WEGOVY 1 MG/0.5ML ~~LOC~~ SOAJ
1.0000 mg | SUBCUTANEOUS | 0 refills | Status: DC
  Filled 2021-08-30: qty 2, 28d supply, fill #0

## 2021-08-31 ENCOUNTER — Other Ambulatory Visit (HOSPITAL_COMMUNITY): Payer: Self-pay

## 2021-09-06 ENCOUNTER — Other Ambulatory Visit (HOSPITAL_COMMUNITY): Payer: Self-pay

## 2021-09-06 DIAGNOSIS — E669 Obesity, unspecified: Secondary | ICD-10-CM | POA: Diagnosis not present

## 2021-09-06 DIAGNOSIS — Z9884 Bariatric surgery status: Secondary | ICD-10-CM | POA: Diagnosis not present

## 2021-09-06 DIAGNOSIS — Z6835 Body mass index (BMI) 35.0-35.9, adult: Secondary | ICD-10-CM | POA: Diagnosis not present

## 2021-09-06 MED ORDER — WEGOVY 2.4 MG/0.75ML ~~LOC~~ SOAJ
2.4000 mg | SUBCUTANEOUS | 0 refills | Status: DC
Start: 2021-09-06 — End: 2021-11-22
  Filled 2021-09-06 – 2021-10-22 (×2): qty 3, 28d supply, fill #0

## 2021-09-06 MED ORDER — WEGOVY 1.7 MG/0.75ML ~~LOC~~ SOAJ
1.7000 mg | SUBCUTANEOUS | 0 refills | Status: DC
  Filled 2021-09-06 – 2021-09-13 (×2): qty 3, 28d supply, fill #0

## 2021-09-13 ENCOUNTER — Other Ambulatory Visit (HOSPITAL_COMMUNITY): Payer: Self-pay

## 2021-09-19 ENCOUNTER — Other Ambulatory Visit (HOSPITAL_COMMUNITY): Payer: Self-pay

## 2021-10-22 ENCOUNTER — Other Ambulatory Visit (HOSPITAL_COMMUNITY): Payer: Self-pay

## 2021-10-23 ENCOUNTER — Other Ambulatory Visit (HOSPITAL_COMMUNITY): Payer: Self-pay

## 2021-10-24 ENCOUNTER — Encounter (HOSPITAL_COMMUNITY): Payer: Self-pay | Admitting: *Deleted

## 2021-11-08 ENCOUNTER — Other Ambulatory Visit (HOSPITAL_COMMUNITY): Payer: Self-pay

## 2021-11-08 ENCOUNTER — Other Ambulatory Visit: Payer: Self-pay | Admitting: Family Medicine

## 2021-11-08 ENCOUNTER — Ambulatory Visit (INDEPENDENT_AMBULATORY_CARE_PROVIDER_SITE_OTHER): Payer: 59 | Admitting: Family Medicine

## 2021-11-08 VITALS — BP 122/86 | HR 67 | Temp 98.1°F | Ht 63.5 in | Wt 196.0 lb

## 2021-11-08 DIAGNOSIS — Z0001 Encounter for general adult medical examination with abnormal findings: Secondary | ICD-10-CM

## 2021-11-08 DIAGNOSIS — Z0000 Encounter for general adult medical examination without abnormal findings: Secondary | ICD-10-CM

## 2021-11-08 DIAGNOSIS — N951 Menopausal and female climacteric states: Secondary | ICD-10-CM

## 2021-11-08 DIAGNOSIS — M25561 Pain in right knee: Secondary | ICD-10-CM

## 2021-11-08 DIAGNOSIS — Z1231 Encounter for screening mammogram for malignant neoplasm of breast: Secondary | ICD-10-CM

## 2021-11-08 DIAGNOSIS — Z6834 Body mass index (BMI) 34.0-34.9, adult: Secondary | ICD-10-CM | POA: Diagnosis not present

## 2021-11-08 DIAGNOSIS — M7051 Other bursitis of knee, right knee: Secondary | ICD-10-CM | POA: Diagnosis not present

## 2021-11-08 DIAGNOSIS — E6609 Other obesity due to excess calories: Secondary | ICD-10-CM

## 2021-11-08 DIAGNOSIS — E782 Mixed hyperlipidemia: Secondary | ICD-10-CM

## 2021-11-08 DIAGNOSIS — F439 Reaction to severe stress, unspecified: Secondary | ICD-10-CM | POA: Diagnosis not present

## 2021-11-08 MED ORDER — ESTRADIOL 0.025 MG/24HR TD PTWK
0.0250 mg | MEDICATED_PATCH | TRANSDERMAL | 2 refills | Status: DC
  Filled 2021-11-08: qty 4, 28d supply, fill #0

## 2021-11-08 NOTE — Progress Notes (Signed)
Subjective:     Cheryl Craig is a 56 y.o. female and is here for a comprehensive physical exam. The patient reports sharp pain in medial R knee over the wknd.  Does not recall increased activity.  Patient notes increased stress as her sister recently passed unexpectedly.  Patient's husband is also battling cancer of maxillary sinus.  Patient states her boys are doing well.  Patient endorses increased hot flashes 2/2 menopause.  Tried over-the-counter medications such as black collage without success.  Patient notes causing insomnia and weight gain.  Patient interested in weight loss medication.  Social History   Socioeconomic History   Marital status: Married    Spouse name: Not on file   Number of children: Not on file   Years of education: Not on file   Highest education level: Not on file  Occupational History   Not on file  Tobacco Use   Smoking status: Never   Smokeless tobacco: Never  Vaping Use   Vaping Use: Never used  Substance and Sexual Activity   Alcohol use: Yes    Alcohol/week: 0.0 standard drinks of alcohol    Comment: very rare, twice per year   Drug use: No   Sexual activity: Not on file  Other Topics Concern   Not on file  Social History Narrative   Not on file   Social Determinants of Health   Financial Resource Strain: Not on file  Food Insecurity: Not on file  Transportation Needs: Not on file  Physical Activity: Not on file  Stress: Not on file  Social Connections: Not on file  Intimate Partner Violence: Not on file   Health Maintenance  Topic Date Due   HIV Screening  Never done   Zoster Vaccines- Shingrix (1 of 2) Never done   INFLUENZA VACCINE  12/04/2021   MAMMOGRAM  11/01/2022   PAP SMEAR-Modifier  11/04/2022   TETANUS/TDAP  11/03/2029   COLONOSCOPY (Pts 45-71yr Insurance coverage will need to be confirmed)  02/19/2030   COVID-19 Vaccine  Completed   Hepatitis C Screening  Completed   HPV VACCINES  Aged Out    The following portions  of the patient's history were reviewed and updated as appropriate: allergies, current medications, past family history, past medical history, past social history, past surgical history, and problem list.  Review of Systems Pertinent items noted in HPI and remainder of comprehensive ROS otherwise negative.   Objective:    BP 122/86 (BP Location: Right Arm, Patient Position: Sitting, Cuff Size: Normal)   Pulse 67   Temp 98.1 F (36.7 C) (Oral)   Ht 5' 3.5" (1.613 m)   Wt 196 lb (88.9 kg)   LMP 11/02/2017 (Approximate)   SpO2 99%   BMI 34.18 kg/m  General appearance: alert, cooperative, and no distress Head: Normocephalic, without obvious abnormality, atraumatic Eyes: conjunctivae/corneas clear. PERRL, EOM's intact. Fundi benign. Ears: normal TM's and external ear canals both ears Nose: Nares normal. Septum midline. Mucosa normal. No drainage or sinus tenderness. Throat: lips, mucosa, and tongue normal; teeth and gums normal Neck: no adenopathy, no carotid bruit, no JVD, supple, symmetrical, trachea midline, and thyroid not enlarged, symmetric, no tenderness/mass/nodules Lungs: clear to auscultation bilaterally Heart: regular rate and rhythm, S1, S2 normal, no murmur, click, rub or gallop Abdomen: soft, non-tender; bowel sounds normal; no masses,  no organomegaly Extremities: extremities normal, atraumatic, no cyanosis or edema Pulses: 2+ and symmetric Skin: Skin color, texture, turgor normal. No rashes or lesions Lymph nodes: Cervical,  supraclavicular, and axillary nodes normal. Neurologic: Alert and oriented X 3, normal strength and tone. Normal symmetric reflexes. Normal coordination and gait    Assessment:    Healthy female exam.      Plan:    Anticipatory guidance given including wearing seatbelts, smoke detectors in the home, increasing physical activity, increasing p.o. intake of water and vegetables.  See After Visit Summary for Counseling Recommendations  -Mammogram  scheduled next wk -pap done 11/05/19.  Repeat pap due 2024 -colonoscopy done 02/20/2020 -immunizations reviewed. -next CPE in 1 yr  - Plan: Basic metabolic panel  Acute pain of right knee  - Plan: CBC with Differential/Platelet  Infrapatellar bursitis of right knee -Discussed supportive care including topical analgesics such as Aspercreme, heat, ice, rest, elevation, immobilization, Tylenol or NSAIDs -For continued worsening symptoms refer to Ortho or sports med - Plan: CBC with Differential/Platelet  Mixed hyperlipidemia -Total cholesterol 240, HDL 87, LDL 137, triglycerides 74 on 10/30/2020 -Lifestyle modifications  - Plan: Hemoglobin A1c, Lipid panel  Class 1 obesity due to excess calories without serious comorbidity with body mass index (BMI) of 34.0 to 34.9 in adult -Body mass index is 34.18 kg/m. -continue PFYTWK as managed by wt management -continue lifestyle modifications  - Plan: Vitamin D, 25-hydroxy, Vitamin B12, TSH, T4, Free  Vasomotor symptoms due to menopause  -discussed r/b/a of HRT and unopposed estrogen. -We will start lowest dose possible for the shortest amount of time and reassess in 3 months. - Plan: estradiol (CLIMARA - DOSED IN MG/24 HR) 0.025 mg/24hr patch  Stress -continue self care -consider counseling -continue to monitor  F/u in 4-6 wks, sooner if needed.  Grier Mitts, MD

## 2021-11-09 ENCOUNTER — Other Ambulatory Visit (HOSPITAL_COMMUNITY): Payer: Self-pay

## 2021-11-09 ENCOUNTER — Telehealth: Payer: Self-pay | Admitting: Family Medicine

## 2021-11-09 DIAGNOSIS — N951 Menopausal and female climacteric states: Secondary | ICD-10-CM

## 2021-11-09 LAB — BASIC METABOLIC PANEL
BUN: 10 mg/dL (ref 6–23)
CO2: 28 mEq/L (ref 19–32)
Calcium: 9.6 mg/dL (ref 8.4–10.5)
Chloride: 106 mEq/L (ref 96–112)
Creatinine, Ser: 0.8 mg/dL (ref 0.40–1.20)
GFR: 83.86 mL/min (ref >60.00)
Glucose, Bld: 77 mg/dL (ref 70–99)
Potassium: 3.8 mEq/L (ref 3.5–5.1)
Sodium: 142 mEq/L (ref 135–145)

## 2021-11-09 LAB — CBC WITH DIFFERENTIAL/PLATELET
Basophils Absolute: 0 10*3/uL (ref 0.0–0.1)
Basophils Relative: 0.9 % (ref 0.0–3.0)
Eosinophils Absolute: 0.1 10*3/uL (ref 0.0–0.7)
Eosinophils Relative: 1.7 % (ref 0.0–5.0)
HCT: 35.7 % — ABNORMAL LOW (ref 36.0–46.0)
Hemoglobin: 11.9 g/dL — ABNORMAL LOW (ref 12.0–15.0)
Lymphocytes Relative: 53 % — ABNORMAL HIGH (ref 12.0–46.0)
Lymphs Abs: 2.8 10*3/uL (ref 0.7–4.0)
MCHC: 33.3 g/dL (ref 30.0–36.0)
MCV: 88 fl (ref 78.0–100.0)
Monocytes Absolute: 0.3 10*3/uL (ref 0.1–1.0)
Monocytes Relative: 5.9 % (ref 3.0–12.0)
Neutro Abs: 2 10*3/uL (ref 1.4–7.7)
Neutrophils Relative %: 38.5 % — ABNORMAL LOW (ref 43.0–77.0)
Platelets: 292 10*3/uL (ref 150.0–400.0)
RBC: 4.06 Mil/uL (ref 3.87–5.11)
RDW: 14.5 % (ref 11.5–15.5)
WBC: 5.2 10*3/uL (ref 4.0–10.5)

## 2021-11-09 LAB — TSH: TSH: 0.61 u[IU]/mL (ref 0.35–5.50)

## 2021-11-09 LAB — VITAMIN D 25 HYDROXY (VIT D DEFICIENCY, FRACTURES): VITD: 75.6 ng/mL (ref 30.00–100.00)

## 2021-11-09 LAB — LIPID PANEL
Cholesterol: 236 mg/dL — ABNORMAL HIGH (ref 0–200)
HDL: 73.3 mg/dL (ref >39.00)
LDL Cholesterol: 148 mg/dL — ABNORMAL HIGH (ref 0–99)
NonHDL: 162.85
Total CHOL/HDL Ratio: 3
Triglycerides: 74 mg/dL (ref 0.0–149.0)
VLDL: 14.8 mg/dL (ref 0.0–40.0)

## 2021-11-09 LAB — VITAMIN B12: Vitamin B-12: 1070 pg/mL — ABNORMAL HIGH (ref 211–911)

## 2021-11-09 LAB — HEMOGLOBIN A1C: Hgb A1c MFr Bld: 5.7 % (ref 4.6–6.5)

## 2021-11-09 LAB — T4, FREE: Free T4: 0.88 ng/dL (ref 0.60–1.60)

## 2021-11-09 MED ORDER — ESTRADIOL 0.025 MG/24HR TD PTWK
0.0250 mg | MEDICATED_PATCH | TRANSDERMAL | 2 refills | Status: DC
  Filled 2021-11-09: qty 4, 28d supply, fill #0

## 2021-11-09 NOTE — Telephone Encounter (Signed)
RX resent to [facility]

## 2021-11-09 NOTE — Telephone Encounter (Signed)
Pt is calling checking on status of estradiol (CLIMARA - DOSED IN MG/24 HR) 0.025 mg/24hr patch the rx did not go through. Please resubmit  [facility] Phone:  [phone]  Fax:  [phone]

## 2021-11-12 ENCOUNTER — Ambulatory Visit
Admission: RE | Admit: 2021-11-12 | Discharge: 2021-11-12 | Disposition: A | Discharge status: Home / Self Care | Payer: 59 | Source: Ambulatory Visit | Attending: Family Medicine | Admitting: Family Medicine

## 2021-11-12 DIAGNOSIS — Z1231 Encounter for screening mammogram for malignant neoplasm of breast: Secondary | ICD-10-CM

## 2021-11-22 ENCOUNTER — Other Ambulatory Visit (HOSPITAL_COMMUNITY): Payer: Self-pay

## 2021-11-22 MED ORDER — WEGOVY 2.4 MG/0.75ML ~~LOC~~ SOAJ
2.4000 mg | SUBCUTANEOUS | 0 refills | Status: DC
  Filled 2021-11-22: qty 3, 28d supply, fill #0

## 2021-11-23 ENCOUNTER — Other Ambulatory Visit (HOSPITAL_COMMUNITY): Payer: Self-pay

## 2021-12-05 ENCOUNTER — Other Ambulatory Visit (HOSPITAL_COMMUNITY): Payer: Self-pay

## 2021-12-12 ENCOUNTER — Encounter (INDEPENDENT_AMBULATORY_CARE_PROVIDER_SITE_OTHER): Payer: Self-pay

## 2021-12-19 ENCOUNTER — Other Ambulatory Visit (HOSPITAL_COMMUNITY): Payer: Self-pay

## 2021-12-19 MED ORDER — WEGOVY 2.4 MG/0.75ML ~~LOC~~ SOAJ
2.4000 mg | SUBCUTANEOUS | 0 refills | Status: DC
  Filled 2021-12-19: qty 3, 28d supply, fill #0

## 2021-12-20 ENCOUNTER — Ambulatory Visit: Payer: 59 | Admitting: Family Medicine

## 2021-12-20 ENCOUNTER — Encounter: Payer: Self-pay | Admitting: Family Medicine

## 2021-12-20 ENCOUNTER — Other Ambulatory Visit (HOSPITAL_COMMUNITY): Payer: Self-pay

## 2021-12-20 VITALS — BP 118/86 | HR 67 | Temp 98.0°F | Wt 188.6 lb

## 2021-12-20 DIAGNOSIS — Z6834 Body mass index (BMI) 34.0-34.9, adult: Secondary | ICD-10-CM

## 2021-12-20 DIAGNOSIS — E6609 Other obesity due to excess calories: Secondary | ICD-10-CM | POA: Diagnosis not present

## 2021-12-20 DIAGNOSIS — N951 Menopausal and female climacteric states: Secondary | ICD-10-CM | POA: Diagnosis not present

## 2021-12-20 LAB — VITAMIN B12: Vitamin B-12: 1010 pg/mL — ABNORMAL HIGH (ref 211–911)

## 2021-12-20 LAB — CBC WITH DIFFERENTIAL/PLATELET
Basophils Absolute: 0 10*3/uL (ref 0.0–0.1)
Basophils Relative: 0.3 % (ref 0.0–3.0)
Eosinophils Absolute: 0.1 10*3/uL (ref 0.0–0.7)
Eosinophils Relative: 2.2 % (ref 0.0–5.0)
HCT: 34.9 % — ABNORMAL LOW (ref 36.0–46.0)
Hemoglobin: 11.5 g/dL — ABNORMAL LOW (ref 12.0–15.0)
Lymphocytes Relative: 47.8 % — ABNORMAL HIGH (ref 12.0–46.0)
Lymphs Abs: 2.6 10*3/uL (ref 0.7–4.0)
MCHC: 33 g/dL (ref 30.0–36.0)
MCV: 88.2 fl (ref 78.0–100.0)
Monocytes Absolute: 0.4 10*3/uL (ref 0.1–1.0)
Monocytes Relative: 7.4 % (ref 3.0–12.0)
Neutro Abs: 2.3 10*3/uL (ref 1.4–7.7)
Neutrophils Relative %: 42.3 % — ABNORMAL LOW (ref 43.0–77.0)
Platelets: 281 10*3/uL (ref 150.0–400.0)
RBC: 3.96 Mil/uL (ref 3.87–5.11)
RDW: 14.3 % (ref 11.5–15.5)
WBC: 5.5 10*3/uL (ref 4.0–10.5)

## 2021-12-20 LAB — COMPREHENSIVE METABOLIC PANEL
ALT: 14 U/L (ref 0–35)
AST: 17 U/L (ref 0–37)
Albumin: 4.2 g/dL (ref 3.5–5.2)
Alkaline Phosphatase: 70 U/L (ref 39–117)
BUN: 12 mg/dL (ref 6–23)
CO2: 28 mEq/L (ref 19–32)
Calcium: 9.2 mg/dL (ref 8.4–10.5)
Chloride: 106 mEq/L (ref 96–112)
Creatinine, Ser: 0.86 mg/dL (ref 0.40–1.20)
GFR: 76.83 mL/min (ref >60.00)
Glucose, Bld: 87 mg/dL (ref 70–99)
Potassium: 3.9 mEq/L (ref 3.5–5.1)
Sodium: 142 mEq/L (ref 135–145)
Total Bilirubin: 0.7 mg/dL (ref 0.2–1.2)
Total Protein: 7.5 g/dL (ref 6.0–8.3)

## 2021-12-20 LAB — LIPID PANEL
Cholesterol: 225 mg/dL — ABNORMAL HIGH (ref 0–200)
HDL: 70.7 mg/dL (ref >39.00)
LDL Cholesterol: 143 mg/dL — ABNORMAL HIGH (ref 0–99)
NonHDL: 154.53
Total CHOL/HDL Ratio: 3
Triglycerides: 58 mg/dL (ref 0.0–149.0)
VLDL: 11.6 mg/dL (ref 0.0–40.0)

## 2021-12-20 LAB — VITAMIN D 25 HYDROXY (VIT D DEFICIENCY, FRACTURES): VITD: 73.16 ng/mL (ref 30.00–100.00)

## 2021-12-20 MED ORDER — ESTRADIOL 0.0375 MG/24HR TD PTWK
0.0375 mg | MEDICATED_PATCH | TRANSDERMAL | 3 refills | Status: AC
  Filled 2021-12-20: qty 4, 28d supply, fill #0

## 2021-12-20 NOTE — Progress Notes (Signed)
Subjective:    Patient ID: Cheryl Craig, female    DOB: [birth year], 56 y.o.   MRN: [id number]  Chief Complaint  Patient presents with   Follow-up    Med  Would like more labs for next weeks appt.    HPI Patient was seen today for follow-up.  Patient unsure if estrogen patch improving hot flash symptoms.  Pt endorses wt loss on wegovy, ~8 lbs in 6 wks. patient states she has not noticed a decrease in appetite while on medication.  States prior to medication she did not eat much.  Endorses eating enough protein daily.  Has upcoming appt with Wt management.   Inquires if can have labs (CBC, CMP, B12, folate, prealbumin, lipids, vitamin D, ferritin) drawn while in clinic.    Pt states her sons are doing well.  They start their senior year at [employer] next wk.  Pt's husband switched to new chemo agent for cancer in L maxillary sinus.  Pt concerned he may be getting sick from the chemo or developing an infection.  Past Medical History:  Diagnosis Date   GERD (gastroesophageal reflux disease)    Obesity    Plantar fasciitis     No Known Allergies  ROS General: Denies fever, chills, night sweats, changes in weight, changes in appetite +hot flashes, changes in wt. HEENT: Denies headaches, ear pain, changes in vision, rhinorrhea, sore throat CV: Denies CP, palpitations, SOB, orthopnea Pulm: Denies SOB, cough, wheezing GI: Denies abdominal pain, nausea, vomiting, diarrhea, constipation GU: Denies dysuria, hematuria, frequency, vaginal discharge Msk: Denies muscle cramps, joint pains Neuro: Denies weakness, numbness, tingling Skin: Denies rashes, bruising Psych: Denies depression, anxiety, hallucinations     Objective:    Blood pressure 118/86, pulse 67, temperature 98 F (36.7 C), temperature source Oral, weight 188 lb 9.6 oz (85.5 kg), last menstrual period 11/02/2017, SpO2 99 %.  Gen. Pleasant, well-nourished, in no distress, normal affect   HEENT: [place]/AT, face symmetric, conjunctiva  clear, no scleral icterus, PERRLA, EOMI, nares patent without drainage Lungs: no accessory muscle use Cardiovascular: RRR, no peripheral edema Musculoskeletal: No deformities, no cyanosis or clubbing, normal tone Neuro:  A&Ox3, CN II-XII intact, normal gait Skin:  Warm, no lesions/ rash   Wt Readings from Last 3 Encounters:  12/20/21 188 lb 9.6 oz (85.5 kg)  11/08/21 196 lb (88.9 kg)  10/30/20 220 lb 3.2 oz (99.9 kg)    Lab Results  Component Value Date   WBC 5.2 11/08/2021   HGB 11.9 (L) 11/08/2021   HCT 35.7 (L) 11/08/2021   PLT 292.0 11/08/2021   GLUCOSE 77 11/08/2021   CHOL 236 (H) 11/08/2021   TRIG 74.0 11/08/2021   HDL 73.30 11/08/2021   LDLCALC 148 (H) 11/08/2021   ALT 15 10/30/2020   AST 17 10/30/2020   NA 142 11/08/2021   K 3.8 11/08/2021   CL 106 11/08/2021   CREATININE 0.80 11/08/2021   BUN 10 11/08/2021   CO2 28 11/08/2021   TSH 0.61 11/08/2021   INR 1.0 05/13/2019   HGBA1C 5.7 11/08/2021    Assessment/Plan:  Vasomotor symptoms due to menopause  -As no improvement in symptoms with estradiol 0.025 mg/24h patch will discontinue. -We will start slightly increased dose. -Continue to monitor as discussed r/b/a of HRT. - Plan: estradiol (CLIMARA - DOSED IN MG/24 HR) 0.0375 mg/24hr patch  Class 1 obesity due to excess calories without serious comorbidity with body mass index (BMI) of 34.0 to 34.9 in adult -8 pound weight loss in  the last on Wegovy -Continue Wegovy -Continue follow-up with weight management -Continue lifestyle modifications  - Plan: CBC with Differential/Platelet, CMP, Vitamin B12, Prealbumin, Lipid panel, Vitamin D, 25-hydroxy, Iron, TIBC and Ferritin Panel  F/u as needed in the next 2 months  Grier Mitts, MD

## 2021-12-21 LAB — IRON,TIBC AND FERRITIN PANEL
%SAT: 25 % (calc) (ref 16–45)
Ferritin: 235 ng/mL — ABNORMAL HIGH (ref 16–232)
Iron: 65 ug/dL (ref 45–160)
TIBC: 259 mcg/dL (calc) (ref 250–450)

## 2021-12-21 LAB — PREALBUMIN: Prealbumin: 16 mg/dL — ABNORMAL LOW (ref 17–34)

## 2021-12-26 ENCOUNTER — Other Ambulatory Visit (HOSPITAL_COMMUNITY): Payer: Self-pay

## 2021-12-26 DIAGNOSIS — E669 Obesity, unspecified: Secondary | ICD-10-CM | POA: Diagnosis not present

## 2021-12-26 DIAGNOSIS — Z6832 Body mass index (BMI) 32.0-32.9, adult: Secondary | ICD-10-CM | POA: Diagnosis not present

## 2021-12-26 DIAGNOSIS — Z9884 Bariatric surgery status: Secondary | ICD-10-CM | POA: Diagnosis not present

## 2021-12-26 DIAGNOSIS — R634 Abnormal weight loss: Secondary | ICD-10-CM | POA: Diagnosis not present

## 2021-12-26 MED ORDER — WEGOVY 2.4 MG/0.75ML ~~LOC~~ SOAJ
2.4000 mg | SUBCUTANEOUS | 0 refills | Status: DC
  Filled 2021-12-26 – 2022-01-12 (×2): qty 3, 28d supply, fill #0
  Filled 2022-02-13: qty 3, 28d supply, fill #1
  Filled 2022-03-12: qty 3, 28d supply, fill #2

## 2022-01-11 ENCOUNTER — Ambulatory Visit (INDEPENDENT_AMBULATORY_CARE_PROVIDER_SITE_OTHER): Payer: 59

## 2022-01-11 DIAGNOSIS — Z23 Encounter for immunization: Secondary | ICD-10-CM | POA: Diagnosis not present

## 2022-01-14 ENCOUNTER — Other Ambulatory Visit (HOSPITAL_COMMUNITY): Payer: Self-pay

## 2022-02-12 ENCOUNTER — Other Ambulatory Visit (HOSPITAL_COMMUNITY): Payer: Self-pay

## 2022-02-13 ENCOUNTER — Other Ambulatory Visit (HOSPITAL_COMMUNITY): Payer: Self-pay

## 2022-03-07 ENCOUNTER — Other Ambulatory Visit (HOSPITAL_COMMUNITY): Payer: Self-pay

## 2022-03-12 ENCOUNTER — Other Ambulatory Visit (HOSPITAL_COMMUNITY): Payer: Self-pay

## 2022-03-25 ENCOUNTER — Other Ambulatory Visit (HOSPITAL_COMMUNITY): Payer: Self-pay

## 2022-04-02 ENCOUNTER — Other Ambulatory Visit (HOSPITAL_COMMUNITY): Payer: Self-pay

## 2022-04-03 ENCOUNTER — Other Ambulatory Visit (HOSPITAL_COMMUNITY): Payer: Self-pay

## 2022-04-03 DIAGNOSIS — R634 Abnormal weight loss: Secondary | ICD-10-CM | POA: Diagnosis not present

## 2022-04-03 DIAGNOSIS — Z6828 Body mass index (BMI) 28.0-28.9, adult: Secondary | ICD-10-CM | POA: Diagnosis not present

## 2022-04-03 DIAGNOSIS — Z9884 Bariatric surgery status: Secondary | ICD-10-CM | POA: Diagnosis not present

## 2022-04-03 DIAGNOSIS — E663 Overweight: Secondary | ICD-10-CM | POA: Diagnosis not present

## 2022-04-03 DIAGNOSIS — R7303 Prediabetes: Secondary | ICD-10-CM | POA: Diagnosis not present

## 2022-04-03 MED ORDER — WEGOVY 2.4 MG/0.75ML ~~LOC~~ SOAJ
2.4000 mg | SUBCUTANEOUS | 0 refills | Status: DC
  Filled 2022-04-03: qty 9, 84d supply, fill #0

## 2022-04-10 ENCOUNTER — Other Ambulatory Visit (HOSPITAL_COMMUNITY): Payer: Self-pay

## 2022-05-05 IMAGING — MG DIGITAL SCREENING BILAT W/ TOMO W/ CAD
8 series · 8 of 24 positions shown · non-contrast
Comparison: Previous exam(s).

CLINICAL DATA: Screening.

EXAM:
DIGITAL SCREENING BILATERAL MAMMOGRAM WITH TOMO AND CAD

[L MLO synth-2D]
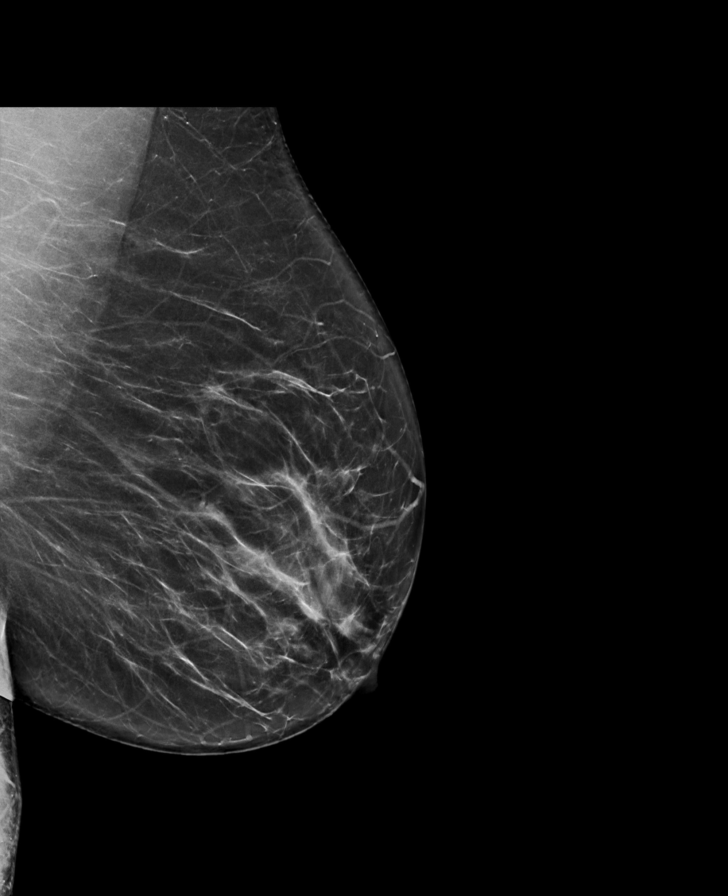

[L CC synth-2D]
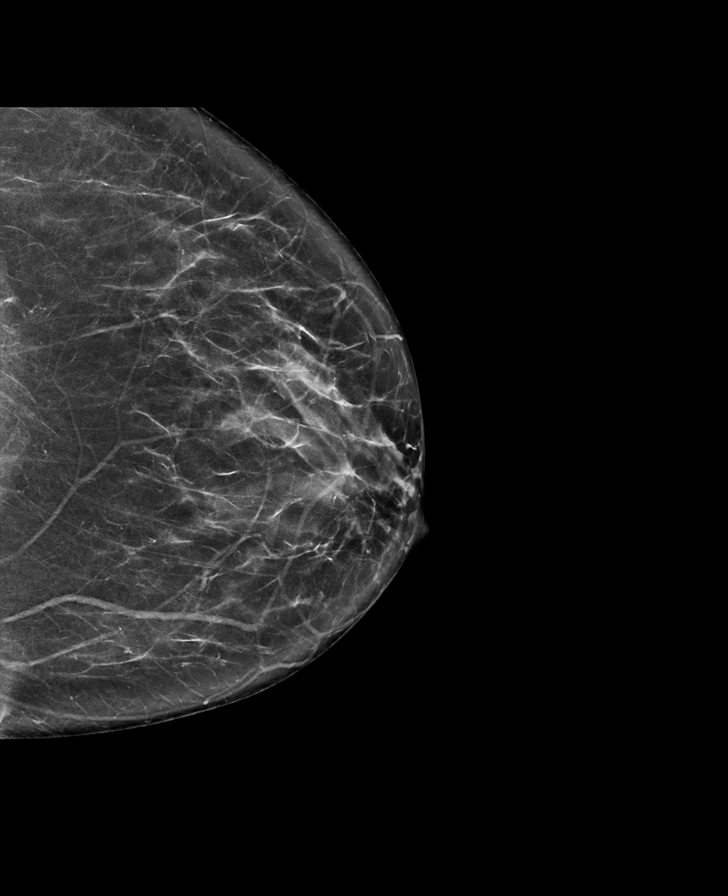

[R MLO synth-2D]
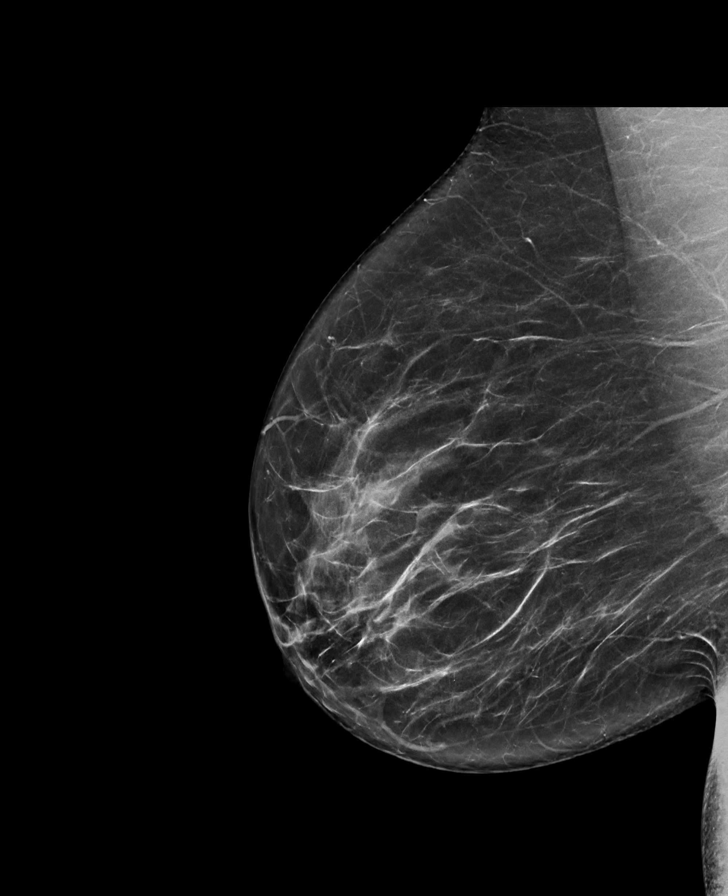

[R CC synth-2D]
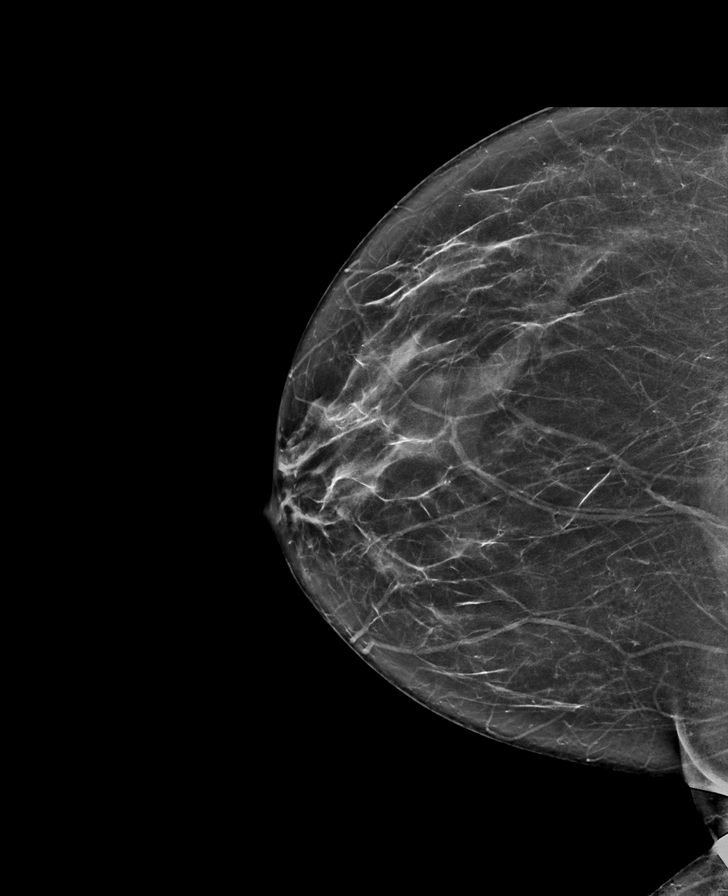

[R MLO tomo · tomo slice 39/76.0]
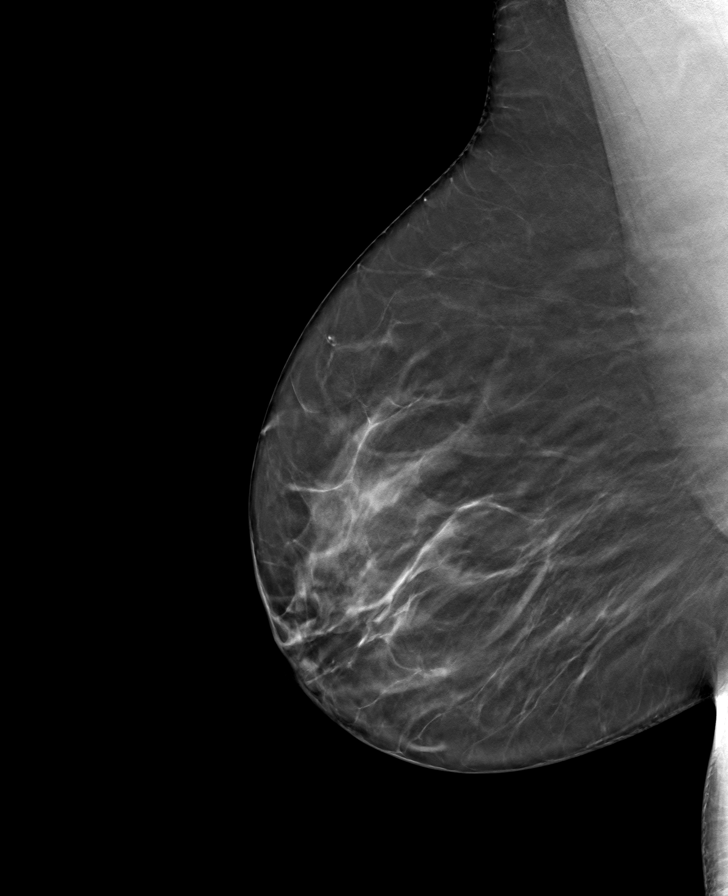

[L CC tomo · tomo slice 35/69.0]
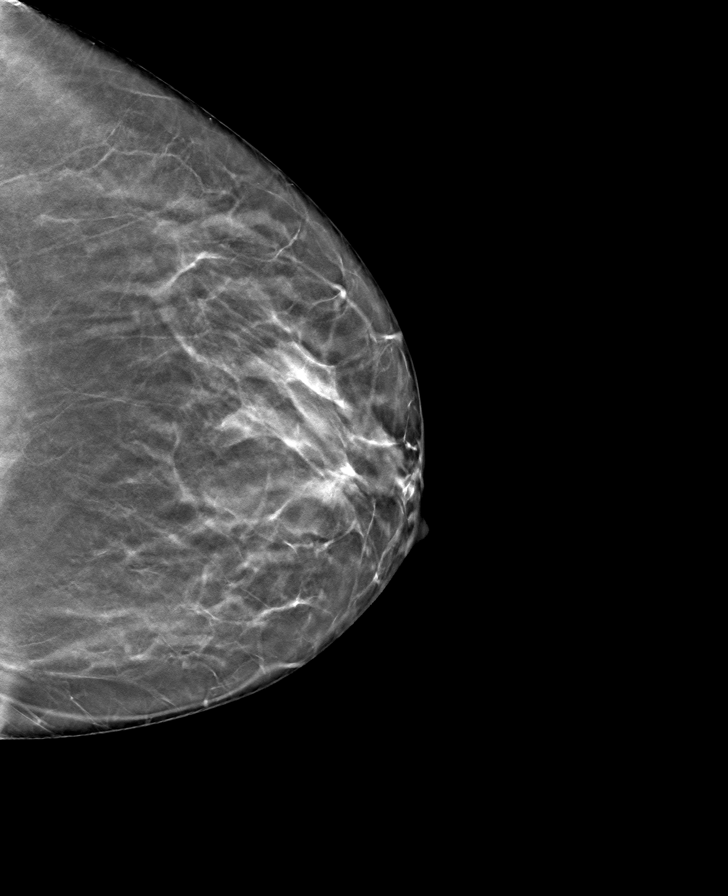

[L MLO tomo · tomo slice 40/79.0]
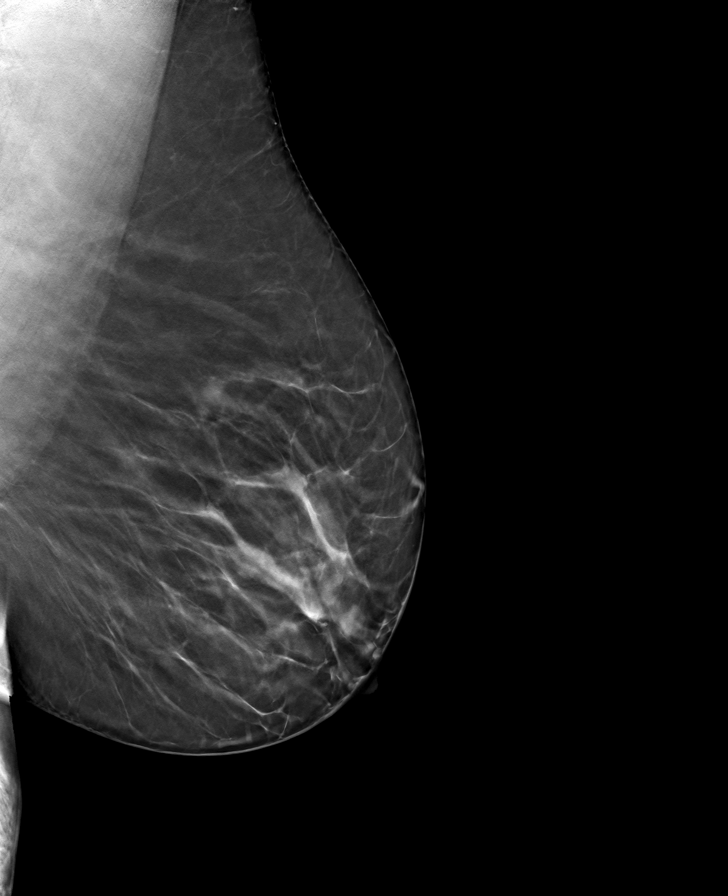

[R CC tomo · tomo slice 34/67.0]
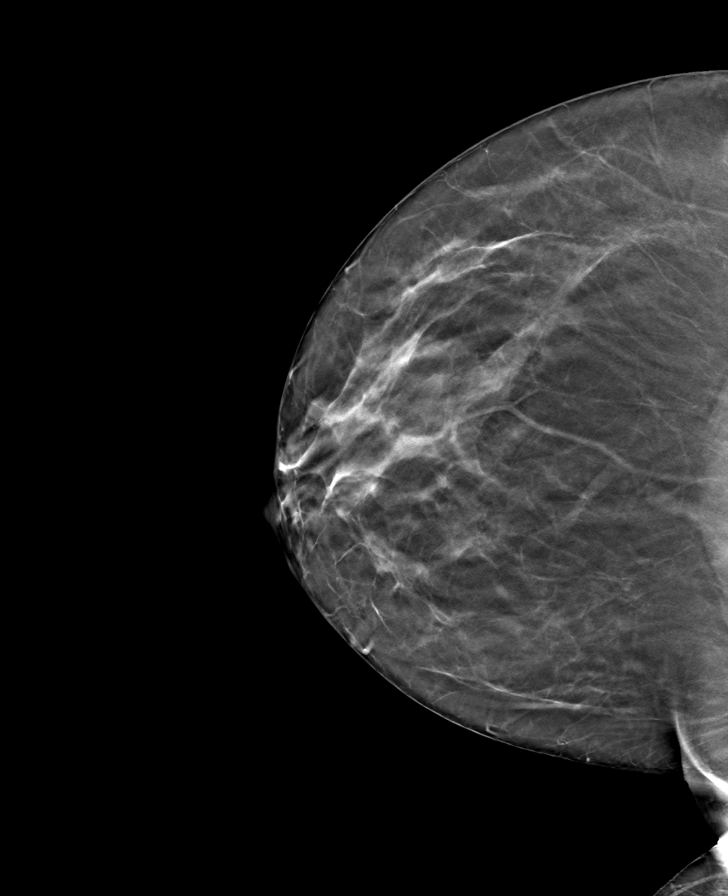

[8 of 24 positions shown; findings below may reference images not displayed]

ACR Breast Density Category b: There are scattered areas of
fibroglandular density.
FINDINGS: There are no findings suspicious for malignancy. Images were
processed with CAD.
IMPRESSION: No mammographic evidence of malignancy. A result letter of this
screening mammogram will be mailed directly to the patient.

RECOMMENDATION:
Screening mammogram in one year. (Code:[id number]-775)

BI-RADS CATEGORY  1: Negative.

## 2022-06-26 ENCOUNTER — Other Ambulatory Visit (HOSPITAL_COMMUNITY): Payer: Self-pay

## 2022-06-26 MED ORDER — WEGOVY 2.4 MG/0.75ML ~~LOC~~ SOAJ
2.4000 mg | SUBCUTANEOUS | 0 refills | Status: DC
  Filled 2022-06-26: qty 9, 84d supply, fill #0

## 2022-07-04 ENCOUNTER — Other Ambulatory Visit (HOSPITAL_COMMUNITY): Payer: Self-pay

## 2022-07-04 MED ORDER — WEGOVY 1.7 MG/0.75ML ~~LOC~~ SOAJ
1.7000 mg | SUBCUTANEOUS | 0 refills | Status: DC
  Filled 2022-07-04: qty 9, 84d supply, fill #0

## 2022-07-15 ENCOUNTER — Other Ambulatory Visit (HOSPITAL_COMMUNITY): Payer: Self-pay

## 2022-08-13 ENCOUNTER — Other Ambulatory Visit (HOSPITAL_COMMUNITY): Payer: Self-pay

## 2022-08-13 MED ORDER — WEGOVY 1.7 MG/0.75ML ~~LOC~~ SOAJ
1.7000 mg | SUBCUTANEOUS | 0 refills | Status: DC
  Filled 2022-08-13: qty 3, 28d supply, fill #0

## 2022-08-19 ENCOUNTER — Other Ambulatory Visit (HOSPITAL_COMMUNITY): Payer: Self-pay

## 2022-09-24 ENCOUNTER — Ambulatory Visit: Payer: Commercial Managed Care - PPO | Admitting: Family Medicine

## 2022-09-24 ENCOUNTER — Encounter: Payer: Self-pay | Admitting: Family Medicine

## 2022-09-24 VITALS — BP 98/70 | HR 98 | Temp 98.2°F | Wt 135.8 lb

## 2022-09-24 DIAGNOSIS — S93492A Sprain of other ligament of left ankle, initial encounter: Secondary | ICD-10-CM

## 2022-09-24 DIAGNOSIS — H6123 Impacted cerumen, bilateral: Secondary | ICD-10-CM

## 2022-09-24 NOTE — Progress Notes (Signed)
   Subjective:    Patient ID: Cheryl Craig, female    DOB: September 01, 1965, 57 y.o.   MRN: [id number]  HPI Here for 2 issues. First about 4 [place] ago [place] left ankle suddenly twisted inward. She felt pain for the next few days, but it never swelled much. Since then she has rolled the ankle 3 more times, but not as bad as the first time. She is not treating this with anything. The other issue is her ears. She has noticed "popping sounds" in both ears for the past 3 weeks, as well as decreased hearing. No ear pain.    Review of Systems  Constitutional: Negative.   HENT:  Positive for hearing loss. Negative for ear pain, sinus pressure and sore throat.   Eyes: Negative.   Respiratory: Negative.    Musculoskeletal:  Positive for arthralgias.       Objective:   Physical Exam Constitutional:      Appearance: Normal appearance.  HENT:     Right Ear: There is impacted cerumen.     Left Ear: There is impacted cerumen.     Mouth/Throat:     Pharynx: Oropharynx is clear.  Eyes:     Conjunctiva/sclera: Conjunctivae normal.  Cardiovascular:     Rate and Rhythm: Normal rate and regular rhythm.     Pulses: Normal pulses.     Heart sounds: Normal heart sounds.  Pulmonary:     Effort: Pulmonary effort is normal.     Breath sounds: Normal breath sounds.  Musculoskeletal:     Comments: The left lateral ankle is mildly tender, but ROM is full and there is no warmth or swelling .   Lymphadenopathy:     Cervical: No cervical adenopathy.  Neurological:     Mental Status: She is alert.           Assessment & Plan:  She has sprained the left ankle, and this has weakened it a bit so that she can turn it easily. I suggested she wear an elastic support sleeve for stability until this has a chance to heal. She also has bilateral cerumen impactions, so after informed consent was obtained both ear canals were irrigated clear with water. She tolerated this well, and  afterwards she could hear normally.  Gershon Crane, MD

## 2022-11-04 ENCOUNTER — Encounter (HOSPITAL_COMMUNITY): Payer: Self-pay | Admitting: *Deleted

## 2022-11-14 ENCOUNTER — Other Ambulatory Visit (HOSPITAL_COMMUNITY)
Admission: RE | Admit: 2022-11-14 | Discharge: 2022-11-14 | Disposition: A | Discharge status: Home / Self Care | Payer: Commercial Managed Care - PPO | Source: Ambulatory Visit | Attending: Family Medicine | Admitting: Family Medicine

## 2022-11-14 ENCOUNTER — Telehealth: Payer: Self-pay | Admitting: Family Medicine

## 2022-11-14 ENCOUNTER — Other Ambulatory Visit (HOSPITAL_COMMUNITY): Payer: Self-pay

## 2022-11-14 ENCOUNTER — Ambulatory Visit (INDEPENDENT_AMBULATORY_CARE_PROVIDER_SITE_OTHER): Payer: Commercial Managed Care - PPO | Admitting: Family Medicine

## 2022-11-14 ENCOUNTER — Encounter: Payer: Self-pay | Admitting: Family Medicine

## 2022-11-14 VITALS — BP 102/70 | HR 62 | Temp 98.1°F | Ht 63.5 in | Wt 129.0 lb

## 2022-11-14 DIAGNOSIS — Z0000 Encounter for general adult medical examination without abnormal findings: Secondary | ICD-10-CM

## 2022-11-14 DIAGNOSIS — Z124 Encounter for screening for malignant neoplasm of cervix: Secondary | ICD-10-CM | POA: Insufficient documentation

## 2022-11-14 DIAGNOSIS — B351 Tinea unguium: Secondary | ICD-10-CM | POA: Diagnosis not present

## 2022-11-14 DIAGNOSIS — H9190 Unspecified hearing loss, unspecified ear: Secondary | ICD-10-CM

## 2022-11-14 DIAGNOSIS — Z9889 Other specified postprocedural states: Secondary | ICD-10-CM | POA: Diagnosis not present

## 2022-11-14 LAB — COMPREHENSIVE METABOLIC PANEL
ALT: 29 U/L (ref 0–35)
AST: 25 U/L (ref 0–37)
Albumin: 4.4 g/dL (ref 3.5–5.2)
Alkaline Phosphatase: 54 U/L (ref 39–117)
BUN: 12 mg/dL (ref 6–23)
CO2: 28 mEq/L (ref 19–32)
Calcium: 9.8 mg/dL (ref 8.4–10.5)
Chloride: 106 mEq/L (ref 96–112)
Creatinine, Ser: 0.81 mg/dL (ref 0.40–1.20)
GFR: 82.04 mL/min (ref >60.00)
Glucose, Bld: 84 mg/dL (ref 70–99)
Potassium: 4 mEq/L (ref 3.5–5.1)
Sodium: 140 mEq/L (ref 135–145)
Total Bilirubin: 0.7 mg/dL (ref 0.2–1.2)
Total Protein: 7.5 g/dL (ref 6.0–8.3)

## 2022-11-14 LAB — VITAMIN B12: Vitamin B-12: 739 pg/mL (ref 211–911)

## 2022-11-14 LAB — CBC WITH DIFFERENTIAL/PLATELET
Basophils Absolute: 0 10*3/uL (ref 0.0–0.1)
Basophils Relative: 0.3 % (ref 0.0–3.0)
Eosinophils Absolute: 0.1 10*3/uL (ref 0.0–0.7)
Eosinophils Relative: 1.7 % (ref 0.0–5.0)
HCT: 35.4 % — ABNORMAL LOW (ref 36.0–46.0)
Hemoglobin: 11.7 g/dL — ABNORMAL LOW (ref 12.0–15.0)
Lymphocytes Relative: 52.2 % — ABNORMAL HIGH (ref 12.0–46.0)
Lymphs Abs: 3 10*3/uL (ref 0.7–4.0)
MCHC: 33.1 g/dL (ref 30.0–36.0)
MCV: 88.9 fl (ref 78.0–100.0)
Monocytes Absolute: 0.3 10*3/uL (ref 0.1–1.0)
Monocytes Relative: 5.1 % (ref 3.0–12.0)
Neutro Abs: 2.3 10*3/uL (ref 1.4–7.7)
Neutrophils Relative %: 40.7 % — ABNORMAL LOW (ref 43.0–77.0)
Platelets: 300 10*3/uL (ref 150.0–400.0)
RBC: 3.98 Mil/uL (ref 3.87–5.11)
RDW: 13.9 % (ref 11.5–15.5)
WBC: 5.8 10*3/uL (ref 4.0–10.5)

## 2022-11-14 LAB — LIPID PANEL
Cholesterol: 269 mg/dL — ABNORMAL HIGH (ref 0–200)
HDL: 76.6 mg/dL (ref >39.00)
LDL Cholesterol: 182 mg/dL — ABNORMAL HIGH (ref 0–99)
NonHDL: 192.23
Total CHOL/HDL Ratio: 4
Triglycerides: 50 mg/dL (ref 0.0–149.0)
VLDL: 10 mg/dL (ref 0.0–40.0)

## 2022-11-14 LAB — T4, FREE: Free T4: 0.89 ng/dL (ref 0.60–1.60)

## 2022-11-14 LAB — HEMOGLOBIN A1C: Hgb A1c MFr Bld: 5.5 % (ref 4.6–6.5)

## 2022-11-14 LAB — VITAMIN D 25 HYDROXY (VIT D DEFICIENCY, FRACTURES): VITD: 74.34 ng/mL (ref 30.00–100.00)

## 2022-11-14 LAB — TSH: TSH: 0.93 u[IU]/mL (ref 0.35–5.50)

## 2022-11-14 LAB — FOLATE: Folate: 14.1 ng/mL (ref >5.9)

## 2022-11-14 MED ORDER — TERBINAFINE HCL 250 MG PO TABS
250.0000 mg | ORAL_TABLET | Freq: Every day | ORAL | 1 refills | Status: AC
Start: 2022-11-14 — End: ?
  Filled 2022-11-14 – 2022-11-15 (×2): qty 90, 90d supply, fill #0

## 2022-11-14 MED ORDER — TERBINAFINE HCL 250 MG PO TABS
250.0000 mg | ORAL_TABLET | Freq: Every day | ORAL | 1 refills | Status: DC
Start: 2022-11-14 — End: 2022-11-14
  Filled 2022-11-14: qty 90, 90d supply, fill #0

## 2022-11-14 NOTE — Progress Notes (Addendum)
Established Patient Office Visit   Subjective  Patient ID: Cheryl Craig, female    DOB: [birth year]  Age: 58 y.o. MRN: [id number]  Chief Complaint  Patient presents with   Annual Exam    Ears still feels clogged, thumbnail.    Patient is a 57 year old female seen for CPE.  Patient states she is taking it one day at a time after the loss of her husband in December 2023.  Pt notes return of discoloration of left second fingernail.  Also has discoloration and thickening of right third toenail.  Inquires about treatment options.  Patient states she was seen to have her ear cleaned after decreased hearing in right ear.  Wears earbud only in right ear.  Patient's sons have often commented that the TV was loud.  Patient seen by bariatrics this week, requesting additional labs to be drawn given history of gastric bypass.  Last Pap 11/05/2019, typically gets every 3 years..  Mammogram done 11/12/2021.    Past Medical History:  Diagnosis Date   GERD (gastroesophageal reflux disease)    Obesity    Plantar fasciitis    Past Surgical History:  Procedure Laterality Date   CESAREAN SECTION     LAPAROSCOPIC GASTRIC SLEEVE RESECTION N/A 04/01/2017   Procedure: LAPAROSCOPIC GASTRIC SLEEVE RESECTION REPAIR OF HIATAL HERNIA  WITH UPPER ENDOSCOPY;  Surgeon: Gaynelle Adu, MD;  Location: [facility];  Service: General;  Laterality: N/A;   Social History   Tobacco Use   Smoking status: Never   Smokeless tobacco: Never  Vaping Use   Vaping status: Never Used  Substance Use Topics   Alcohol use: Yes    Alcohol/week: 0.0 standard drinks of alcohol    Comment: very rare, twice per year   Drug use: No   Family History  Problem Relation Age of Onset   Arthritis Mother    Alzheimer's disease Father    Healthy Son    Healthy Son    Breast cancer Neg Hx    No Known Allergies    ROS Negative unless stated above    Objective:     BP 102/70 (BP Location: Right Arm, Patient Position: Sitting, Cuff  Size: Normal)   Pulse 62   Temp 98.1 F (36.7 C) (Oral)   Ht 5' 3.5" (1.613 m)   Wt 129 lb (58.5 kg)   LMP 11/02/2017 (Approximate)   SpO2 99%   BMI 22.49 kg/m   Hearing Screening   500Hz  1000Hz  2000Hz  4000Hz   Right ear Pass Pass Pass Pass  Left ear Pass Pass Pass Pass    Physical Exam Constitutional:      Appearance: Normal appearance.  HENT:     Head: Normocephalic and atraumatic.     Right Ear: Hearing, tympanic membrane, ear canal and external ear normal.     Left Ear: Hearing, tympanic membrane, ear canal and external ear normal.     Ears:     Comments: Right canal with moderate cerumen not impacted, not obstructing view of TM.    Nose: Nose normal.     Mouth/Throat:     Mouth: Mucous membranes are moist.     Pharynx: No oropharyngeal exudate or posterior oropharyngeal erythema.  Eyes:     General: No scleral icterus.    Extraocular Movements: Extraocular movements intact.     Conjunctiva/sclera: Conjunctivae normal.     Pupils: Pupils are equal, round, and reactive to light.  Neck:     Thyroid: No thyromegaly.  Cardiovascular:  Rate and Rhythm: Normal rate and regular rhythm.     Pulses: Normal pulses.     Heart sounds: Normal heart sounds. No murmur heard.    No friction rub.  Pulmonary:     Effort: Pulmonary effort is normal.     Breath sounds: Normal breath sounds. No wheezing, rhonchi or rales.  Abdominal:     General: Bowel sounds are normal.     Palpations: Abdomen is soft.     Tenderness: There is no abdominal tenderness.  Genitourinary:    General: Normal vulva.     Labia:        Right: No rash, tenderness or lesion.        Left: No rash or lesion.      Vagina: Vaginal discharge present. No erythema.     Cervix: Normal.     Uterus: Normal.      Rectum: Normal.     Comments: Normal vaginal rugae.  Moderate thin whitish-yellow discharge in vaginal vault.  Cervix tilted toward patient's left. Musculoskeletal:        General: No deformity.  Normal range of motion.  Lymphadenopathy:     Cervical: No cervical adenopathy.  Skin:    General: Skin is warm and dry.     Findings: No lesion.     Comments: Left second fingernail with white patch distally.  Right third toenail hypertrophic and darkened in color.  Neurological:     General: No focal deficit present.     Mental Status: She is alert and oriented to person, place, and time.  Psychiatric:        Mood and Affect: Mood normal.        Thought Content: Thought content normal.        11/14/2022    8:20 AM 12/20/2021    8:15 AM 11/08/2021    1:38 PM  Depression screen PHQ 2/9  Decreased Interest 0 0 0  Down, Depressed, Hopeless 0 0 0  PHQ - 2 Score 0 0 0  Altered sleeping 0 1 2  Tired, decreased energy 1 1 1   Change in appetite 0 0 0  Feeling bad or failure about yourself  0 0 0  Trouble concentrating 1 0 0  Moving slowly or fidgety/restless 0 0 0  Suicidal thoughts 0 0 0  PHQ-9 Score 2 2 3   Difficult doing work/chores Not difficult at all Not difficult at all Not difficult at all      11/14/2022    8:20 AM 11/04/2019    2:23 PM  GAD 7 : Generalized Anxiety Score  Nervous, Anxious, on Edge 0 0  Control/stop worrying 0 0  Worry too much - different things 0 1  Trouble relaxing 1 1  Restless 0 0  Easily annoyed or irritable 0 0  Afraid - awful might happen 0 0  Total GAD 7 Score 1 2  Anxiety Difficulty Not difficult at all Not difficult at all     No results found for any visits on 11/14/22.    Assessment & Plan:  Well adult exam -     Lipid panel -     TSH -     T4, free -     Hemoglobin A1c  History of gastric surgery -     Vitamin B12 -     Vitamin B1 -     Iron, TIBC and Ferritin Panel -     Folate -     VITAMIN D 25 Hydroxy (Vit-D  Deficiency, Fractures) -     CBC with Differential/Platelet -     Comprehensive metabolic panel -     TSH -     T4, free -     Hemoglobin A1c  Onychomycosis -     Comprehensive metabolic panel -      Comprehensive metabolic panel; Future -     Terbinafine HCl; Take 1 tablet (250 mg total) by mouth daily.  Dispense: 90 tablet; Refill: 1  Change in hearing, unspecified laterality  Screening for cervical cancer -     Cytology - PAP  Age-appropriate health screenings discussed.  Immunizations reviewed.  Will obtain labs.  Pt schedule mammogram.  Pap done this visit.  Will start terbinafine for onychomycosis of fingernail and toenail.  Will place order for CMP in 1 month to assess LFTs after starting terbinafine.  Hearing test normal.  Return in about 1 month (around 12/15/2022), or if symptoms worsen or fail to improve.   Deeann Saint, MD

## 2022-11-14 NOTE — Telephone Encounter (Signed)
Okay for Pap every 3.  Patient called back.  Will return later this afternoon to have done.

## 2022-11-14 NOTE — Addendum Note (Signed)
Addended by: Abbe Amsterdam R on: 11/14/2022 09:40 AM   Modules accepted: Orders

## 2022-11-14 NOTE — Telephone Encounter (Signed)
Pt was just seen by MD for a CPE Pt states her last pap smear was 11/2019 Pt says she should have one every 3 years Pt states she should have had one during this visit. Pt would like to know if this was an honest mistake? Please call Pt back to discuss.

## 2022-11-14 NOTE — Addendum Note (Signed)
Addended by: Abbe Amsterdam R on: 11/14/2022 04:00 PM   Modules accepted: Orders

## 2022-11-14 NOTE — Patient Instructions (Signed)
We can recheck your liver function in 1 month.  I will place the orders so they will be in the system in case you want to get them done at work or here in clinic.

## 2022-11-15 ENCOUNTER — Other Ambulatory Visit (HOSPITAL_COMMUNITY): Payer: Self-pay

## 2022-11-15 ENCOUNTER — Other Ambulatory Visit: Payer: Self-pay | Admitting: Family Medicine

## 2022-11-15 DIAGNOSIS — Z1231 Encounter for screening mammogram for malignant neoplasm of breast: Secondary | ICD-10-CM

## 2022-11-18 ENCOUNTER — Ambulatory Visit
Admission: RE | Admit: 2022-11-18 | Discharge: 2022-11-18 | Disposition: A | Discharge status: Home / Self Care | Payer: Commercial Managed Care - PPO | Source: Ambulatory Visit | Attending: Family Medicine | Admitting: Family Medicine

## 2022-11-18 DIAGNOSIS — Z1231 Encounter for screening mammogram for malignant neoplasm of breast: Secondary | ICD-10-CM | POA: Diagnosis not present

## 2022-11-18 LAB — CYTOLOGY - PAP
Comment: NEGATIVE
Comment: NEGATIVE
Diagnosis: NEGATIVE
High risk HPV: NEGATIVE
Trichomonas: NEGATIVE

## 2022-11-18 LAB — IRON,TIBC AND FERRITIN PANEL
%SAT: 27 % (calc) (ref 16–45)
Ferritin: 265 ng/mL — ABNORMAL HIGH (ref 16–232)
Iron: 73 ug/dL (ref 45–160)
TIBC: 266 mcg/dL (calc) (ref 250–450)

## 2022-11-18 LAB — VITAMIN B1: Vitamin B1 (Thiamine): 11 nmol/L (ref 8–30)

## 2022-12-12 ENCOUNTER — Other Ambulatory Visit: Payer: Self-pay | Admitting: Family Medicine

## 2022-12-12 DIAGNOSIS — E782 Mixed hyperlipidemia: Secondary | ICD-10-CM

## 2022-12-12 MED ORDER — ROSUVASTATIN CALCIUM 5 MG PO TABS
5.0000 mg | ORAL_TABLET | Freq: Every day | ORAL | 3 refills | Status: DC
Start: 2022-12-12 — End: 2023-11-13
  Filled 2022-12-12: qty 90, 90d supply, fill #0

## 2022-12-13 ENCOUNTER — Other Ambulatory Visit (HOSPITAL_COMMUNITY): Payer: Self-pay

## 2023-02-06 ENCOUNTER — Other Ambulatory Visit (INDEPENDENT_AMBULATORY_CARE_PROVIDER_SITE_OTHER): Payer: Commercial Managed Care - PPO

## 2023-02-06 ENCOUNTER — Ambulatory Visit (INDEPENDENT_AMBULATORY_CARE_PROVIDER_SITE_OTHER): Payer: Commercial Managed Care - PPO | Admitting: *Deleted

## 2023-02-06 DIAGNOSIS — B351 Tinea unguium: Secondary | ICD-10-CM

## 2023-02-06 DIAGNOSIS — Z23 Encounter for immunization: Secondary | ICD-10-CM | POA: Diagnosis not present

## 2023-02-06 LAB — COMPREHENSIVE METABOLIC PANEL
ALT: 35 U/L (ref 0–35)
AST: 25 U/L (ref 0–37)
Albumin: 4.1 g/dL (ref 3.5–5.2)
Alkaline Phosphatase: 52 U/L (ref 39–117)
BUN: 14 mg/dL (ref 6–23)
CO2: 29 meq/L (ref 19–32)
Calcium: 9.2 mg/dL (ref 8.4–10.5)
Chloride: 106 meq/L (ref 96–112)
Creatinine, Ser: 0.83 mg/dL (ref 0.40–1.20)
GFR: 79.54 mL/min (ref >60.00)
Glucose, Bld: 86 mg/dL (ref 70–99)
Potassium: 4 meq/L (ref 3.5–5.1)
Sodium: 141 meq/L (ref 135–145)
Total Bilirubin: 0.6 mg/dL (ref 0.2–1.2)
Total Protein: 7 g/dL (ref 6.0–8.3)

## 2023-02-21 ENCOUNTER — Encounter: Payer: Self-pay | Admitting: Family Medicine

## 2023-02-21 ENCOUNTER — Telehealth: Payer: Self-pay | Admitting: Family Medicine

## 2023-02-21 NOTE — Telephone Encounter (Signed)
Pt is calling and would like on our letterhead that she had flu vaccine with date etc. Please put in her mychart

## 2023-02-21 NOTE — Telephone Encounter (Signed)
Done sent via my chart

## 2023-03-12 ENCOUNTER — Other Ambulatory Visit (HOSPITAL_COMMUNITY): Payer: Self-pay

## 2023-03-12 MED ORDER — TOPIRAMATE 25 MG PO TABS
25.0000 mg | ORAL_TABLET | Freq: Every day | ORAL | 0 refills | Status: DC
  Filled 2023-03-12: qty 90, 90d supply, fill #0

## 2023-04-28 ENCOUNTER — Other Ambulatory Visit (HOSPITAL_COMMUNITY): Payer: Self-pay

## 2023-04-28 MED ORDER — TOPIRAMATE 25 MG PO TABS
25.0000 mg | ORAL_TABLET | Freq: Every day | ORAL | 1 refills | Status: DC
  Filled 2023-04-28: qty 90, 90d supply, fill #0

## 2023-05-19 ENCOUNTER — Encounter: Payer: Self-pay | Admitting: Family Medicine

## 2023-06-05 ENCOUNTER — Other Ambulatory Visit (HOSPITAL_COMMUNITY): Payer: Self-pay

## 2023-06-05 MED ORDER — TOPIRAMATE 50 MG PO TABS
50.0000 mg | ORAL_TABLET | Freq: Every day | ORAL | 1 refills | Status: DC
Start: 2023-06-05 — End: 2023-10-27
  Filled 2023-06-05: qty 90, 90d supply, fill #0
  Filled 2023-08-29: qty 90, 90d supply, fill #1

## 2023-08-28 ENCOUNTER — Telehealth: Payer: Self-pay | Admitting: *Deleted

## 2023-08-28 NOTE — Telephone Encounter (Signed)
 Spoke with patient, patient is aware insurance might not cover Annual visit patient is sch for 10/22/23 Dr. Arliss Lam is aware

## 2023-08-28 NOTE — Telephone Encounter (Signed)
 Copied from CRM [id number]. Topic: Appointments - Appointment Scheduling >> Aug 28, 2023  3:08 PM Lotus Round B wrote: Patient/patient representative is calling to schedule a physical appointment with Dr. Arliss Lam . She was wondering there is any way she can get an appointment around the 1st or 2nd week of June due to going on a cruise and have a lot going on after that so the month of June is perfect . Patient would like a call if this is possible .   Refer to attachments for appointment information.

## 2023-08-29 ENCOUNTER — Other Ambulatory Visit (HOSPITAL_COMMUNITY): Payer: Self-pay

## 2023-09-09 ENCOUNTER — Telehealth: Payer: Self-pay

## 2023-09-09 NOTE — Telephone Encounter (Signed)
 Copied from CRM [id number]. Topic: Clinical - Medication Question >> Sep 09, 2023  9:31 AM Cheryl Craig wrote: Reason for CRM: Pt states she is going on a cruise on 5/10 and would like to be prepared in case she has any motion sickness on the cruise. Would like something called in if possible please.  If so, this is the pharmacy she would prefer to use: [facility] [address] [place] [address] Phone: [phone] Fax: [phone] Hours: M-F 7:30am-6pm   Patients phone number is [phone] if you need to reach her.

## 2023-09-10 NOTE — Telephone Encounter (Signed)
 Copied from CRM [id number]. Topic: Clinical - Medication Question >> Sep 09, 2023  3:31 PM Deaijah H wrote: Reason for CRM: Patient called into see if Dr. Arliss Lam would prescribe something for her cruise for motion sickness per CAL I was sent to Dr. Arliss Lam but she does not work on Tuesdays and should get to message tomorrow. Advised patient. >> Sep 10, 2023  4:23 PM Lovett Ruck C wrote: Patient called again on 5/7 to follow up on this CRM to check and see if she could get something to help her during her cruise. I saw the message was routed to Dr. Arliss Lam this morning but I just wanted to let you know patient was following up again as her cruise leaves on Saturday. Please advise with patient. Thank you

## 2023-09-11 ENCOUNTER — Telehealth: Payer: Self-pay | Admitting: Family Medicine

## 2023-09-11 ENCOUNTER — Encounter: Payer: Self-pay | Admitting: Family Medicine

## 2023-09-11 NOTE — Telephone Encounter (Signed)
 Noted.

## 2023-09-11 NOTE — Telephone Encounter (Signed)
 Called and spoke with patient, per Dr. Arliss Lam the patient would need to sch a Video visit, patient is aware, and doesn't want to " accumulate a bill just for the medication", I let the patient know I would speak with Dr. Arliss Lam.

## 2023-09-11 NOTE — Telephone Encounter (Signed)
 Copied from CRM [id number]. Topic: Clinical - Medication Question >> Sep 11, 2023 11:35 AM Shereese L wrote: Reason for CRM:  Pt states she is going on a cruise on 5/10 and would like to be prepared in case she has any motion sickness on the cruise. Would like something called in if possible please Patient calling in to check the status for the medication requested

## 2023-09-12 ENCOUNTER — Encounter: Payer: Self-pay | Admitting: Family Medicine

## 2023-10-20 ENCOUNTER — Telehealth: Payer: Self-pay

## 2023-10-20 NOTE — Telephone Encounter (Signed)
 Spoke with patient, will return patient's call 6/17 if labs have not been faxed  over.

## 2023-10-20 NOTE — Telephone Encounter (Signed)
 Copied from CRM [id number]. Topic: Appointments - Scheduling Inquiry for Clinic >> Oct 20, 2023 10:54 AM Cheryl Craig I wrote: Reason for CRM: Patient and [facility], wanted to know if labs can be added onto her physical appointment since she has two appts close together and would be best for her to get those labs done while at her physical. If so, they will be faxing over the labs needed.

## 2023-10-21 NOTE — Telephone Encounter (Signed)
 Labs have been received

## 2023-10-22 ENCOUNTER — Other Ambulatory Visit (HOSPITAL_COMMUNITY): Payer: Self-pay

## 2023-10-22 ENCOUNTER — Ambulatory Visit (INDEPENDENT_AMBULATORY_CARE_PROVIDER_SITE_OTHER): Admitting: Family Medicine

## 2023-10-22 ENCOUNTER — Other Ambulatory Visit: Payer: Self-pay

## 2023-10-22 ENCOUNTER — Other Ambulatory Visit: Payer: Self-pay | Admitting: Family Medicine

## 2023-10-22 ENCOUNTER — Encounter: Payer: Self-pay | Admitting: Family Medicine

## 2023-10-22 VITALS — BP 98/64 | HR 61 | Temp 97.8°F | Ht 63.5 in | Wt 129.0 lb

## 2023-10-22 DIAGNOSIS — Z1231 Encounter for screening mammogram for malignant neoplasm of breast: Secondary | ICD-10-CM

## 2023-10-22 DIAGNOSIS — R252 Cramp and spasm: Secondary | ICD-10-CM | POA: Diagnosis not present

## 2023-10-22 DIAGNOSIS — Z0000 Encounter for general adult medical examination without abnormal findings: Secondary | ICD-10-CM | POA: Diagnosis not present

## 2023-10-22 DIAGNOSIS — R4189 Other symptoms and signs involving cognitive functions and awareness: Secondary | ICD-10-CM | POA: Diagnosis not present

## 2023-10-22 DIAGNOSIS — B001 Herpesviral vesicular dermatitis: Secondary | ICD-10-CM | POA: Diagnosis not present

## 2023-10-22 DIAGNOSIS — Z9889 Other specified postprocedural states: Secondary | ICD-10-CM

## 2023-10-22 LAB — VITAMIN D 25 HYDROXY (VIT D DEFICIENCY, FRACTURES): VITD: 63.87 ng/mL (ref 30.00–100.00)

## 2023-10-22 LAB — CBC WITH DIFFERENTIAL/PLATELET
Basophils Absolute: 0 10*3/uL (ref 0.0–0.1)
Basophils Relative: 0.4 % (ref 0.0–3.0)
Eosinophils Absolute: 0.1 10*3/uL (ref 0.0–0.7)
Eosinophils Relative: 1.7 % (ref 0.0–5.0)
HCT: 36.5 % (ref 36.0–46.0)
Hemoglobin: 12.2 g/dL (ref 12.0–15.0)
Lymphocytes Relative: 43.3 % (ref 12.0–46.0)
Lymphs Abs: 2.1 10*3/uL (ref 0.7–4.0)
MCHC: 33.4 g/dL (ref 30.0–36.0)
MCV: 89 fl (ref 78.0–100.0)
Monocytes Absolute: 0.3 10*3/uL (ref 0.1–1.0)
Monocytes Relative: 6.2 % (ref 3.0–12.0)
Neutro Abs: 2.3 10*3/uL (ref 1.4–7.7)
Neutrophils Relative %: 48.4 % (ref 43.0–77.0)
Platelets: 296 10*3/uL (ref 150.0–400.0)
RBC: 4.11 Mil/uL (ref 3.87–5.11)
RDW: 13.9 % (ref 11.5–15.5)
WBC: 4.8 10*3/uL (ref 4.0–10.5)

## 2023-10-22 LAB — LIPID PANEL
Cholesterol: 229 mg/dL — ABNORMAL HIGH (ref 0–200)
HDL: 99.6 mg/dL (ref >39.00)
LDL Cholesterol: 120 mg/dL — ABNORMAL HIGH (ref 0–99)
NonHDL: 128.92
Total CHOL/HDL Ratio: 2
Triglycerides: 47 mg/dL (ref 0.0–149.0)
VLDL: 9.4 mg/dL (ref 0.0–40.0)

## 2023-10-22 LAB — COMPREHENSIVE METABOLIC PANEL WITH GFR
ALT: 24 U/L (ref 0–35)
AST: 22 U/L (ref 0–37)
Albumin: 4.2 g/dL (ref 3.5–5.2)
Alkaline Phosphatase: 63 U/L (ref 39–117)
BUN: 16 mg/dL (ref 6–23)
CO2: 28 meq/L (ref 19–32)
Calcium: 9.3 mg/dL (ref 8.4–10.5)
Chloride: 109 meq/L (ref 96–112)
Creatinine, Ser: 0.88 mg/dL (ref 0.40–1.20)
GFR: 73.78 mL/min (ref >60.00)
Glucose, Bld: 81 mg/dL (ref 70–99)
Potassium: 4.3 meq/L (ref 3.5–5.1)
Sodium: 140 meq/L (ref 135–145)
Total Bilirubin: 0.6 mg/dL (ref 0.2–1.2)
Total Protein: 7.4 g/dL (ref 6.0–8.3)

## 2023-10-22 LAB — FERRITIN: Ferritin: 154 ng/mL (ref 10.0–291.0)

## 2023-10-22 LAB — MAGNESIUM: Magnesium: 2.1 mg/dL (ref 1.5–2.5)

## 2023-10-22 LAB — HEMOGLOBIN A1C: Hgb A1c MFr Bld: 5.9 % (ref 4.6–6.5)

## 2023-10-22 LAB — TSH: TSH: 0.88 u[IU]/mL (ref 0.35–5.50)

## 2023-10-22 LAB — VITAMIN B12: Vitamin B-12: 921 pg/mL — ABNORMAL HIGH (ref 211–911)

## 2023-10-22 LAB — T4, FREE: Free T4: 0.82 ng/dL (ref 0.60–1.60)

## 2023-10-22 MED ORDER — ACYCLOVIR 5 % EX OINT
1.0000 | TOPICAL_OINTMENT | CUTANEOUS | 0 refills | Status: AC
  Filled 2023-10-22: qty 15, 3d supply, fill #0
  Filled 2023-10-22: qty 15, 30d supply, fill #0

## 2023-10-22 NOTE — Progress Notes (Signed)
 Established Patient Office Visit   Subjective  Patient ID: Cheryl Craig, female    DOB: 04-Jul-1965  Age: 58 y.o. MRN: [id number]  Chief Complaint  Patient presents with   Annual Exam    Patient has questions about leg cramping and brain fog     Pt is a 58 yo female seen for CPE.  Pt states she is doing well.  Noticing cramping in foot and at times her legs.  Typically only happens at night, but becoming more frequent.  Moving legs does not relieve discomfort.  Pt typically has to get up and walk to relieve pain.  Pt afraid to move in bed as it can cause the cramps to happen.  Tried yellow mustard which helped in the beginning for symptoms.  Pt on Crestor  5 mg for a while without symptoms.  Brain fog.  Forgetting basic things.  Inquires if associated with menopause.  Previously on estradiol  for hot flashes but stopped with improvement in symptoms.  Unsure if HRT helped with brain fog/if she was having it at that time.  Patient requesting prescription for acyclovir cream for history of cold sores.  Cold sores typically occur if she and greases intake of orange juice and tea.  Tries to avoid these items but feels like she will be having more in the next week or so during a family reunion.  Patient requesting additional labs that were to be drawn next week for [facility] follow-up.  Has a history of sleeve gastrectomy.  Taking vitamins daily.    Patient Active Problem List   Diagnosis Date Noted   Primary osteoarthritis of right knee 10/06/2019   Primary osteoarthritis of left knee 10/06/2019   Chronic pain of right knee 05/17/2019   Discoloration of skin of lower leg 05/17/2019   Chronic midline low back pain 04/01/2017   GERD (gastroesophageal reflux disease) 04/01/2017   Prediabetes 04/01/2017   Obstructive sleep apnea syndrome, mild 04/01/2017   Plantar fasciitis 04/01/2017   Morbid obesity (HCC) 04/01/2017   S/P laparoscopic sleeve gastrectomy 04/01/2017    Vitamin D  deficiency 03/04/2017   Past Medical History:  Diagnosis Date   Arthritis    GERD (gastroesophageal reflux disease)    Obesity    Plantar fasciitis    Past Surgical History:  Procedure Laterality Date   CESAREAN SECTION     LAPAROSCOPIC GASTRIC SLEEVE RESECTION N/A 04/01/2017   Procedure: LAPAROSCOPIC GASTRIC SLEEVE RESECTION REPAIR OF HIATAL HERNIA  WITH UPPER ENDOSCOPY;  Surgeon: Aldean Hummingbird, MD;  Location: [facility];  Service: General;  Laterality: N/A;   Social History   Tobacco Use   Smoking status: Never   Smokeless tobacco: Never  Vaping Use   Vaping status: Never Used  Substance Use Topics   Alcohol use: Yes    Alcohol/week: 0.0 standard drinks of alcohol    Comment: very rare, twice per year   Drug use: No   Family History  Problem Relation Age of Onset   Arthritis Mother    Diabetes Mother    Alzheimer's disease Father    Healthy Son    Healthy Son    Breast cancer Neg Hx    No Known Allergies  ROS Negative unless stated above    Objective:     BP 98/64 (BP Location: Right Arm, Patient Position: Sitting, Cuff Size: Normal)   Pulse 61   Temp 97.8 F (36.6 C) (Oral)   Ht 5' 3.5 (1.613 m)   Wt 129  lb (58.5 kg)   LMP 11/02/2017 (Approximate)   SpO2 99%   BMI 22.49 kg/m  BP Readings from Last 3 Encounters:  10/22/23 98/64  11/14/22 102/70  09/24/22 98/70   Wt Readings from Last 3 Encounters:  10/22/23 129 lb (58.5 kg)  11/14/22 129 lb (58.5 kg)  09/24/22 135 lb 12.8 oz (61.6 kg)      Physical Exam Constitutional:      General: She is not in acute distress.    Appearance: Normal appearance.  HENT:     Head: Normocephalic and atraumatic.     Right Ear: Tympanic membrane, ear canal and external ear normal.     Left Ear: Tympanic membrane, ear canal and external ear normal.     Nose: Nose normal.     Mouth/Throat:     Mouth: Mucous membranes are moist.     Pharynx: No oropharyngeal exudate or posterior oropharyngeal erythema.    Eyes:     General: No scleral icterus.    Extraocular Movements: Extraocular movements intact.     Conjunctiva/sclera: Conjunctivae normal.     Pupils: Pupils are equal, round, and reactive to light.   Neck:     Thyroid : No thyromegaly.   Cardiovascular:     Rate and Rhythm: Normal rate and regular rhythm.     Pulses: Normal pulses.     Heart sounds: Normal heart sounds. No murmur heard.    No friction rub. No gallop.  Pulmonary:     Effort: Pulmonary effort is normal. No respiratory distress.     Breath sounds: Normal breath sounds. No wheezing, rhonchi or rales.  Abdominal:     General: Bowel sounds are normal.     Palpations: Abdomen is soft.     Tenderness: There is no abdominal tenderness.   Musculoskeletal:        General: No deformity. Normal range of motion.  Lymphadenopathy:     Cervical: No cervical adenopathy.   Skin:    General: Skin is warm and dry.     Findings: No lesion.   Neurological:     General: No focal deficit present.     Mental Status: She is alert and oriented to person, place, and time.   Psychiatric:        Mood and Affect: Mood normal.        Thought Content: Thought content normal.        11/14/2022    8:20 AM 12/20/2021    8:15 AM 11/08/2021    1:38 PM  Depression screen PHQ 2/9  Decreased Interest 0 0 0  Down, Depressed, Hopeless 0 0 0  PHQ - 2 Score 0 0 0  Altered sleeping 0 1 2  Tired, decreased energy 1 1 1   Change in appetite 0 0 0  Feeling bad or failure about yourself  0 0 0  Trouble concentrating 1 0 0  Moving slowly or fidgety/restless 0 0 0  Suicidal thoughts 0 0 0  PHQ-9 Score 2 2 3   Difficult doing work/chores Not difficult at all Not difficult at all Not difficult at all      11/14/2022    8:20 AM 11/04/2019    2:23 PM  GAD 7 : Generalized Anxiety Score  Nervous, Anxious, on Edge 0 0  Control/stop worrying 0 0  Worry too much - different things 0 1  Trouble relaxing 1 1  Restless 0 0  Easily annoyed or  irritable 0 0  Afraid - awful might happen  0 0  Total GAD 7 Score 1 2  Anxiety Difficulty Not difficult at all Not difficult at all     No results found for any visits on 10/22/23.    Assessment & Plan:   Well adult exam -     CBC with Differential/Platelet; Future -     Comprehensive metabolic panel with GFR; Future -     Hemoglobin A1c; Future -     Lipid panel; Future -     T4, free; Future -     TSH; Future  Herpes labialis -     Acyclovir; Apply 1 Application topically every 4 (four) hours.  Dispense: 15 g; Refill: 0  History of gastric surgery -     CBC with Differential/Platelet; Future -     Hemoglobin A1c; Future -     Lipid panel; Future -     T4, free; Future -     TSH; Future -     Vitamin B12; Future -     VITAMIN D  25 Hydroxy (Vit-D Deficiency, Fractures); Future -     Vitamin B1 -     Iron and TIBC -     Ferritin -     Prealbumin  Leg cramps -     CBC with Differential/Platelet; Future -     Comprehensive metabolic panel with GFR; Future -     VITAMIN D  25 Hydroxy (Vit-D Deficiency, Fractures); Future -     Iron and TIBC -     Magnesium; Future  Brain fog -     CBC with Differential/Platelet; Future -     Comprehensive metabolic panel with GFR; Future -     Vitamin B12; Future -     VITAMIN D  25 Hydroxy (Vit-D Deficiency, Fractures); Future  Age-appropriate health screenings discussed.  Obtain labs.  Additional labs per request of [facility] also obtained.  Last Pap 11/18/2022.  Colonoscopy done 02/24/2020.  Mammogram 11/18/2022.  Immunizations reviewed.  Shingles dose 1 done 02/06/2023.  Will have patient obtain/find documentation if additional dose was received.  Discussed possible causes of leg cramps including vitamin or electrolyte deficiency, medications, etc.  Further recommendations based on results.  Advised brain fog possibly related to menopause.  Consider restarting HRT for worsening symptoms.  Return if symptoms worsen or fail  to improve.   Viola Greulich, MD

## 2023-10-23 ENCOUNTER — Other Ambulatory Visit (HOSPITAL_COMMUNITY): Payer: Self-pay

## 2023-10-23 LAB — IRON, TOTAL/TOTAL IRON BINDING CAP: TIBC: 287 ug/dL (ref 250–450)

## 2023-10-23 LAB — PREALBUMIN: Prealbumin: 18 mg/dL (ref 17–34)

## 2023-10-24 ENCOUNTER — Encounter: Payer: Self-pay | Admitting: Family Medicine

## 2023-10-25 LAB — IRON, TOTAL/TOTAL IRON BINDING CAP
%SAT: 32 % (ref 16–45)
Iron: 91 ug/dL (ref 45–160)

## 2023-10-25 LAB — VITAMIN B1: Vitamin B1 (Thiamine): 13 nmol/L (ref 8–30)

## 2023-10-27 ENCOUNTER — Other Ambulatory Visit (HOSPITAL_COMMUNITY): Payer: Self-pay

## 2023-10-27 MED ORDER — TOPIRAMATE 50 MG PO TABS
50.0000 mg | ORAL_TABLET | Freq: Every day | ORAL | 4 refills | Status: AC
  Filled 2023-10-27 – 2024-05-20 (×4): qty 90, 90d supply, fill #0

## 2023-10-28 ENCOUNTER — Other Ambulatory Visit (HOSPITAL_COMMUNITY): Payer: Self-pay

## 2023-10-28 MED ORDER — TOPIRAMATE 50 MG PO TABS
50.0000 mg | ORAL_TABLET | Freq: Two times a day (BID) | ORAL | 3 refills | Status: DC
  Filled 2023-10-28: qty 180, 90d supply, fill #0

## 2023-10-31 ENCOUNTER — Other Ambulatory Visit (HOSPITAL_COMMUNITY): Payer: Self-pay

## 2023-10-31 ENCOUNTER — Other Ambulatory Visit (HOSPITAL_BASED_OUTPATIENT_CLINIC_OR_DEPARTMENT_OTHER): Payer: Self-pay

## 2023-10-31 ENCOUNTER — Encounter (HOSPITAL_COMMUNITY): Payer: Self-pay | Admitting: *Deleted

## 2023-10-31 MED ORDER — TOPIRAMATE 25 MG PO TABS
75.0000 mg | ORAL_TABLET | Freq: Every day | ORAL | 1 refills | Status: DC
  Filled 2023-10-31: qty 180, 60d supply, fill #0
  Filled 2024-01-12: qty 180, 60d supply, fill #1
  Filled 2024-01-12 (×2): qty 180, 60d supply, fill #0

## 2023-11-03 ENCOUNTER — Ambulatory Visit: Payer: Self-pay | Admitting: Family Medicine

## 2023-11-03 DIAGNOSIS — E782 Mixed hyperlipidemia: Secondary | ICD-10-CM

## 2023-11-03 NOTE — Telephone Encounter (Signed)
 The second dose of shingles vaccine does appear due.  This can be done at your convenience here in the office or at your local pharmacy.

## 2023-11-13 ENCOUNTER — Other Ambulatory Visit (HOSPITAL_COMMUNITY): Payer: Self-pay

## 2023-11-13 MED ORDER — ROSUVASTATIN CALCIUM 10 MG PO TABS
10.0000 mg | ORAL_TABLET | Freq: Every day | ORAL | 3 refills | Status: AC
  Filled 2023-11-13: qty 90, 90d supply, fill #0
  Filled 2024-01-12 – 2024-03-15 (×2): qty 90, 90d supply, fill #1
  Filled 2024-05-20 – 2024-05-26 (×2): qty 90, 90d supply, fill #0

## 2023-11-19 ENCOUNTER — Ambulatory Visit
Admission: RE | Admit: 2023-11-19 | Discharge: 2023-11-19 | Disposition: A | Discharge status: Home / Self Care | Source: Ambulatory Visit | Attending: Family Medicine | Admitting: Family Medicine

## 2023-11-19 DIAGNOSIS — Z1231 Encounter for screening mammogram for malignant neoplasm of breast: Secondary | ICD-10-CM | POA: Diagnosis not present

## 2024-01-12 ENCOUNTER — Other Ambulatory Visit (HOSPITAL_COMMUNITY): Payer: Self-pay

## 2024-01-12 ENCOUNTER — Other Ambulatory Visit (HOSPITAL_BASED_OUTPATIENT_CLINIC_OR_DEPARTMENT_OTHER): Payer: Self-pay

## 2024-01-23 ENCOUNTER — Other Ambulatory Visit (HOSPITAL_COMMUNITY): Payer: Self-pay

## 2024-01-23 MED ORDER — FLUZONE 0.5 ML IM SUSY
0.5000 mL | PREFILLED_SYRINGE | Freq: Once | INTRAMUSCULAR | 0 refills | Status: AC
Start: 2024-01-23 — End: 2024-01-24
  Filled 2024-01-23: qty 0.5, 1d supply, fill #0

## 2024-02-27 ENCOUNTER — Other Ambulatory Visit (HOSPITAL_COMMUNITY): Payer: Self-pay

## 2024-03-15 ENCOUNTER — Other Ambulatory Visit: Payer: Self-pay

## 2024-03-17 ENCOUNTER — Other Ambulatory Visit (HOSPITAL_COMMUNITY): Payer: Self-pay

## 2024-03-17 ENCOUNTER — Encounter (HOSPITAL_COMMUNITY): Payer: Self-pay

## 2024-03-19 ENCOUNTER — Other Ambulatory Visit (HOSPITAL_COMMUNITY): Payer: Self-pay

## 2024-03-23 ENCOUNTER — Other Ambulatory Visit: Payer: Self-pay

## 2024-03-23 ENCOUNTER — Other Ambulatory Visit (HOSPITAL_COMMUNITY): Payer: Self-pay

## 2024-03-23 MED ORDER — TOPIRAMATE 25 MG PO TABS
75.0000 mg | ORAL_TABLET | Freq: Every day | ORAL | 1 refills | Status: AC
  Filled 2024-03-23: qty 180, 60d supply, fill #0
  Filled 2024-05-20: qty 180, 60d supply, fill #1

## 2024-03-29 DIAGNOSIS — H5213 Myopia, bilateral: Secondary | ICD-10-CM | POA: Diagnosis not present

## 2024-03-29 DIAGNOSIS — Z135 Encounter for screening for eye and ear disorders: Secondary | ICD-10-CM | POA: Diagnosis not present

## 2024-03-29 DIAGNOSIS — H524 Presbyopia: Secondary | ICD-10-CM | POA: Diagnosis not present

## 2024-03-29 DIAGNOSIS — H52223 Regular astigmatism, bilateral: Secondary | ICD-10-CM | POA: Diagnosis not present

## 2024-05-20 ENCOUNTER — Other Ambulatory Visit (HOSPITAL_COMMUNITY): Payer: Self-pay

## 2024-05-20 ENCOUNTER — Other Ambulatory Visit: Payer: Self-pay

## 2024-05-25 ENCOUNTER — Other Ambulatory Visit (HOSPITAL_COMMUNITY): Payer: Self-pay

## 2024-05-25 MED ORDER — SEMAGLUTIDE-WEIGHT MANAGEMENT 1.5 MG PO TABS
1.5000 mg | ORAL_TABLET | Freq: Every day | ORAL | 0 refills | Status: AC
  Filled 2024-05-25: qty 30, 30d supply, fill #0

## 2024-05-26 ENCOUNTER — Other Ambulatory Visit (HOSPITAL_COMMUNITY): Payer: Self-pay
